# Patient Record
Sex: Female | Born: 1955
Health system: Southern US, Community
[De-identification: ages and names within clinical notes are randomized; demographics above are authoritative.]

## PROBLEM LIST (undated history)

## (undated) DIAGNOSIS — I214 Non-ST elevation (NSTEMI) myocardial infarction: Secondary | ICD-10-CM

## (undated) DIAGNOSIS — K219 Gastro-esophageal reflux disease without esophagitis: Secondary | ICD-10-CM

## (undated) DIAGNOSIS — I2510 Atherosclerotic heart disease of native coronary artery without angina pectoris: Secondary | ICD-10-CM

## (undated) DIAGNOSIS — I10 Essential (primary) hypertension: Secondary | ICD-10-CM

## (undated) DIAGNOSIS — R9430 Abnormal result of cardiovascular function study, unspecified: Secondary | ICD-10-CM

## (undated) DIAGNOSIS — IMO0002 Reserved for concepts with insufficient information to code with codable children: Secondary | ICD-10-CM

## (undated) DIAGNOSIS — N189 Chronic kidney disease, unspecified: Secondary | ICD-10-CM

## (undated) HISTORY — DX: Reserved for concepts with insufficient information to code with codable children: IMO0002

## (undated) HISTORY — DX: Non-ST elevation (NSTEMI) myocardial infarction: I21.4

## (undated) HISTORY — DX: Chronic kidney disease, unspecified: N18.9

## (undated) HISTORY — PX: CORONARY STENT PLACEMENT: SHX1402

## (undated) HISTORY — DX: Essential (primary) hypertension: I10

## (undated) HISTORY — DX: Abnormal result of cardiovascular function study, unspecified: R94.30

---

## 1999-04-30 ENCOUNTER — Emergency Department (HOSPITAL_COMMUNITY): Admission: EM | Admit: 1999-04-30 | Discharge: 1999-04-30 | Payer: Self-pay | Admitting: Emergency Medicine

## 1999-05-06 ENCOUNTER — Encounter: Admission: RE | Admit: 1999-05-06 | Discharge: 1999-05-06 | Payer: Self-pay | Admitting: Internal Medicine

## 1999-07-22 ENCOUNTER — Encounter: Admission: RE | Admit: 1999-07-22 | Discharge: 1999-07-22 | Payer: Self-pay | Admitting: Hematology and Oncology

## 1999-12-25 ENCOUNTER — Emergency Department (HOSPITAL_COMMUNITY): Admission: EM | Admit: 1999-12-25 | Discharge: 1999-12-25 | Payer: Self-pay | Admitting: Emergency Medicine

## 2001-05-28 ENCOUNTER — Emergency Department (HOSPITAL_COMMUNITY): Admission: EM | Admit: 2001-05-28 | Discharge: 2001-05-28 | Payer: Self-pay | Admitting: Emergency Medicine

## 2002-03-15 ENCOUNTER — Emergency Department (HOSPITAL_COMMUNITY): Admission: EM | Admit: 2002-03-15 | Discharge: 2002-03-15 | Payer: Self-pay | Admitting: Emergency Medicine

## 2002-03-15 ENCOUNTER — Encounter: Payer: Self-pay | Admitting: Emergency Medicine

## 2002-12-01 ENCOUNTER — Emergency Department (HOSPITAL_COMMUNITY): Admission: EM | Admit: 2002-12-01 | Discharge: 2002-12-01 | Payer: Self-pay | Admitting: Emergency Medicine

## 2002-12-02 ENCOUNTER — Emergency Department (HOSPITAL_COMMUNITY): Admission: EM | Admit: 2002-12-02 | Discharge: 2002-12-02 | Payer: Self-pay | Admitting: Emergency Medicine

## 2006-07-15 ENCOUNTER — Emergency Department (HOSPITAL_COMMUNITY): Admission: EM | Admit: 2006-07-15 | Discharge: 2006-07-15 | Payer: Self-pay | Admitting: Emergency Medicine

## 2006-08-17 ENCOUNTER — Emergency Department (HOSPITAL_COMMUNITY): Admission: EM | Admit: 2006-08-17 | Discharge: 2006-08-17 | Payer: Self-pay | Admitting: *Deleted

## 2007-02-08 ENCOUNTER — Emergency Department (HOSPITAL_COMMUNITY): Admission: EM | Admit: 2007-02-08 | Discharge: 2007-02-08 | Payer: Self-pay | Admitting: Emergency Medicine

## 2007-07-13 ENCOUNTER — Emergency Department (HOSPITAL_COMMUNITY): Admission: EM | Admit: 2007-07-13 | Discharge: 2007-07-13 | Payer: Self-pay | Admitting: Emergency Medicine

## 2008-07-11 ENCOUNTER — Emergency Department (HOSPITAL_COMMUNITY): Admission: EM | Admit: 2008-07-11 | Discharge: 2008-07-11 | Payer: Self-pay | Admitting: Emergency Medicine

## 2009-08-06 ENCOUNTER — Emergency Department (HOSPITAL_COMMUNITY): Admission: EM | Admit: 2009-08-06 | Discharge: 2009-08-06 | Payer: Self-pay | Admitting: Emergency Medicine

## 2009-10-23 ENCOUNTER — Encounter (INDEPENDENT_AMBULATORY_CARE_PROVIDER_SITE_OTHER): Payer: Self-pay | Admitting: Family Medicine

## 2009-10-23 ENCOUNTER — Ambulatory Visit: Payer: Self-pay | Admitting: Internal Medicine

## 2009-10-23 LAB — CONVERTED CEMR LAB
BUN: 9 mg/dL (ref 6–23)
CO2: 18 meq/L — ABNORMAL LOW (ref 19–32)
Calcium: 10 mg/dL (ref 8.4–10.5)
Chloride: 108 meq/L (ref 96–112)
Creatinine, Ser: 0.65 mg/dL (ref 0.40–1.20)
Eosinophils Absolute: 0 10*3/uL (ref 0.0–0.7)
Eosinophils Relative: 0 % (ref 0–5)
Glucose, Bld: 90 mg/dL (ref 70–99)
HCT: 44.6 % (ref 36.0–46.0)
Lymphs Abs: 2.2 10*3/uL (ref 0.7–4.0)
MCV: 97.8 fL (ref 78.0–100.0)
Monocytes Absolute: 0.3 10*3/uL (ref 0.1–1.0)
Monocytes Relative: 6 % (ref 3–12)
Platelets: 313 10*3/uL (ref 150–400)
RBC: 4.56 M/uL (ref 3.87–5.11)
TSH: 1.183 microintl units/mL (ref 0.350–4.500)
WBC: 5 10*3/uL (ref 4.0–10.5)

## 2010-02-27 ENCOUNTER — Ambulatory Visit (HOSPITAL_COMMUNITY): Admission: RE | Admit: 2010-02-27 | Discharge: 2010-02-27 | Payer: Self-pay | Admitting: Internal Medicine

## 2010-03-03 ENCOUNTER — Ambulatory Visit (HOSPITAL_COMMUNITY): Admission: RE | Admit: 2010-03-03 | Discharge: 2010-03-03 | Payer: Self-pay | Admitting: Internal Medicine

## 2010-03-07 ENCOUNTER — Encounter: Admission: RE | Admit: 2010-03-07 | Discharge: 2010-03-07 | Payer: Self-pay | Admitting: Internal Medicine

## 2010-06-15 ENCOUNTER — Encounter: Payer: Self-pay | Admitting: Internal Medicine

## 2010-08-17 LAB — DIFFERENTIAL
Eosinophils Absolute: 0 10*3/uL (ref 0.0–0.7)
Eosinophils Relative: 1 % (ref 0–5)
Lymphocytes Relative: 40 % (ref 12–46)
Lymphs Abs: 1.8 10*3/uL (ref 0.7–4.0)
Monocytes Absolute: 0.3 10*3/uL (ref 0.1–1.0)
Monocytes Relative: 6 % (ref 3–12)

## 2010-08-17 LAB — COMPREHENSIVE METABOLIC PANEL
ALT: 54 U/L — ABNORMAL HIGH (ref 0–35)
AST: 60 U/L — ABNORMAL HIGH (ref 0–37)
Albumin: 3.9 g/dL (ref 3.5–5.2)
CO2: 22 mEq/L (ref 19–32)
Calcium: 9.2 mg/dL (ref 8.4–10.5)
GFR calc Af Amer: >60 mL/min (ref >60)
GFR calc non Af Amer: >60 mL/min (ref >60)
Sodium: 140 mEq/L (ref 135–145)
Total Protein: 7.7 g/dL (ref 6.0–8.3)

## 2010-08-17 LAB — RAPID URINE DRUG SCREEN, HOSP PERFORMED
Amphetamines: NOT DETECTED
Benzodiazepines: NOT DETECTED
Opiates: NOT DETECTED
Tetrahydrocannabinol: NOT DETECTED

## 2010-08-17 LAB — POCT CARDIAC MARKERS
CKMB, poc: 1.3 ng/mL (ref 1.0–8.0)
Troponin i, poc: <0.05 ng/mL (ref 0.00–0.09)

## 2010-08-17 LAB — CBC
MCHC: 32.9 g/dL (ref 30.0–36.0)
Platelets: 261 10*3/uL (ref 150–400)
RBC: 4.53 MIL/uL (ref 3.87–5.11)

## 2010-08-17 LAB — URINALYSIS, ROUTINE W REFLEX MICROSCOPIC
Bilirubin Urine: NEGATIVE
Nitrite: NEGATIVE
Protein, ur: NEGATIVE mg/dL
Specific Gravity, Urine: 1.006 (ref 1.005–1.030)
Urobilinogen, UA: 0.2 mg/dL (ref 0.0–1.0)

## 2010-08-17 LAB — URINE MICROSCOPIC-ADD ON

## 2011-02-20 ENCOUNTER — Other Ambulatory Visit (HOSPITAL_COMMUNITY): Payer: Self-pay | Admitting: Family Medicine

## 2011-02-20 DIAGNOSIS — Z1231 Encounter for screening mammogram for malignant neoplasm of breast: Secondary | ICD-10-CM

## 2011-03-11 ENCOUNTER — Ambulatory Visit (HOSPITAL_COMMUNITY)
Admission: RE | Admit: 2011-03-11 | Discharge: 2011-03-11 | Disposition: A | Discharge status: Home / Self Care | Payer: Self-pay | Source: Ambulatory Visit | Attending: Family Medicine | Admitting: Family Medicine

## 2011-03-11 DIAGNOSIS — Z1231 Encounter for screening mammogram for malignant neoplasm of breast: Secondary | ICD-10-CM | POA: Insufficient documentation

## 2012-03-28 ENCOUNTER — Other Ambulatory Visit (HOSPITAL_COMMUNITY): Payer: Self-pay | Admitting: Family Medicine

## 2013-04-13 ENCOUNTER — Emergency Department (HOSPITAL_COMMUNITY): Payer: Self-pay

## 2013-04-13 ENCOUNTER — Encounter (HOSPITAL_COMMUNITY): Payer: Self-pay | Admitting: Emergency Medicine

## 2013-04-13 ENCOUNTER — Emergency Department (HOSPITAL_COMMUNITY)
Admission: EM | Admit: 2013-04-13 | Discharge: 2013-04-14 | Disposition: A | Discharge status: Home / Self Care | Payer: Self-pay | Attending: Emergency Medicine | Admitting: Emergency Medicine

## 2013-04-13 ENCOUNTER — Emergency Department (INDEPENDENT_AMBULATORY_CARE_PROVIDER_SITE_OTHER)
Admission: EM | Admit: 2013-04-13 | Discharge: 2013-04-13 | Disposition: A | Discharge status: Nursing Home (Includes ICF) | Payer: Self-pay | Source: Home / Self Care | Attending: Family Medicine | Admitting: Family Medicine

## 2013-04-13 DIAGNOSIS — R252 Cramp and spasm: Secondary | ICD-10-CM

## 2013-04-13 DIAGNOSIS — K047 Periapical abscess without sinus: Secondary | ICD-10-CM

## 2013-04-13 DIAGNOSIS — R259 Unspecified abnormal involuntary movements: Secondary | ICD-10-CM

## 2013-04-13 DIAGNOSIS — F17200 Nicotine dependence, unspecified, uncomplicated: Secondary | ICD-10-CM | POA: Insufficient documentation

## 2013-04-13 DIAGNOSIS — K044 Acute apical periodontitis of pulpal origin: Secondary | ICD-10-CM | POA: Insufficient documentation

## 2013-04-13 DIAGNOSIS — Z79899 Other long term (current) drug therapy: Secondary | ICD-10-CM | POA: Insufficient documentation

## 2013-04-13 LAB — BASIC METABOLIC PANEL
BUN: 9 mg/dL (ref 6–23)
CO2: 23 mEq/L (ref 19–32)
Chloride: 101 mEq/L (ref 96–112)
Creatinine, Ser: 0.69 mg/dL (ref 0.50–1.10)
GFR calc Af Amer: >90 mL/min (ref >90)
Potassium: 4 mEq/L (ref 3.5–5.1)

## 2013-04-13 LAB — CBC WITH DIFFERENTIAL/PLATELET
Basophils Relative: 0 % (ref 0–1)
HCT: 40.6 % (ref 36.0–46.0)
Hemoglobin: 14.5 g/dL (ref 12.0–15.0)
Lymphocytes Relative: 29 % (ref 12–46)
Monocytes Absolute: 0.5 10*3/uL (ref 0.1–1.0)
Monocytes Relative: 7 % (ref 3–12)
Neutro Abs: 4.1 10*3/uL (ref 1.7–7.7)
Neutrophils Relative %: 63 % (ref 43–77)
RBC: 4.5 MIL/uL (ref 3.87–5.11)
WBC: 6.5 10*3/uL (ref 4.0–10.5)

## 2013-04-13 MED ORDER — IOHEXOL 300 MG/ML  SOLN
70.0000 mL | Freq: Once | INTRAMUSCULAR | Status: AC | PRN
  Administered 2013-04-13: 70 mL via INTRAVENOUS

## 2013-04-13 MED ORDER — MORPHINE SULFATE 4 MG/ML IJ SOLN
6.0000 mg | Freq: Once | INTRAMUSCULAR | Status: AC
  Administered 2013-04-13: 6 mg via INTRAVENOUS
  Filled 2013-04-13: qty 2

## 2013-04-13 MED ORDER — AMPICILLIN-SULBACTAM SODIUM 3 (2-1) G IJ SOLR
3.0000 g | Freq: Once | INTRAMUSCULAR | Status: AC
  Administered 2013-04-13: 3 g via INTRAVENOUS
  Filled 2013-04-13: qty 3

## 2013-04-13 MED ORDER — SODIUM CHLORIDE 0.9 % IV SOLN
1000.0000 mL | INTRAVENOUS | Status: DC
  Administered 2013-04-13: 1000 mL via INTRAVENOUS

## 2013-04-13 MED ORDER — SODIUM CHLORIDE 0.9 % IV SOLN
1000.0000 mL | Freq: Once | INTRAVENOUS | Status: AC
  Administered 2013-04-13: 1000 mL via INTRAVENOUS

## 2013-04-13 NOTE — ED Notes (Signed)
Pleural rub auscultated in Left Upper Lobe.

## 2013-04-13 NOTE — ED Notes (Signed)
Pt c/o facial swelling at jaw, throat onset 2 weeks... Pain radiates to both ears Sxs also include: fevers, hurts to swallow and to open mouth Denies: v/n/d   BP is 211/117... Notified Zack B, PA Denies: CP, weakness, nauseas, diaphoresis Alert w/no signs of acute distress.

## 2013-04-13 NOTE — ED Provider Notes (Signed)
Medical screening examination/treatment/procedure(s) were performed by resident physician or non-physician practitioner and as supervising physician I was immediately available for consultation/collaboration.   Barkley Bruns MD.   Linna Hoff, MD 04/13/13 2112

## 2013-04-13 NOTE — ED Provider Notes (Signed)
CSN: [id number]     Arrival date & time 04/13/13  1655 History   First MD Initiated Contact with Patient 04/13/13 1922     Chief Complaint  Patient presents with  . Facial Swelling    HPI Patient reports ongoing facial pain and dental pain over the past 2 weeks.  She was sent to the emergency department from the urgent care where she was seen and evaluated for possible dental infection.  She reports pain with opening her mouth.  She denies pain under her chin and oriented her anterior neck.  No fevers or chills.  She does not have a dentist.  She's been known to have poor dental decay for some time.  She does report some pain with swallowing.  She denies sore throat.   History reviewed. No pertinent past medical history. History reviewed. No pertinent past surgical history. History reviewed. No pertinent family history. History  Substance Use Topics  . Smoking status: Current Every Day Smoker -- 1.00 packs/day    Types: Cigarettes  . Smokeless tobacco: Not on file  . Alcohol Use: No   OB History   Grav Para Term Preterm Abortions TAB SAB Ect Mult Living                 Review of Systems  All other systems reviewed and are negative.    Allergies  Review of patient's allergies indicates no known allergies.  Home Medications   Current Outpatient Rx  Name  Route  Sig  Dispense  Refill  . acetaminophen (TYLENOL) 500 MG tablet   Oral   Take 1,000 mg by mouth every 6 (six) hours as needed for mild pain.         . Aspirin-Salicylamide-Caffeine (BC HEADACHE POWDER PO)   Oral   Take 1 packet by mouth daily as needed (pain).         Marland Kitchen ibuprofen (ADVIL,MOTRIN) 200 MG tablet   Oral   Take 400 mg by mouth every 6 (six) hours as needed for moderate pain.         Marland Kitchen HYDROcodone-acetaminophen (HYCET) 7.5-325 mg/15 ml solution   Oral   Take 15 mLs by mouth 4 (four) times daily as needed for moderate pain.   120 mL   0   . penicillin v potassium (VEETID) 500 MG tablet    Oral   Take 1 tablet (500 mg total) by mouth 4 (four) times daily.   28 tablet   0    BP 199/90  Pulse 73  Temp(Src) 98.6 F (37 C) (Oral)  Resp 19  Wt 140 lb 8 oz (63.73 kg)  SpO2 97% Physical Exam  Nursing note and vitals reviewed. Constitutional: She is oriented to person, place, and time. She appears well-developed and well-nourished. No distress.  HENT:  Head: Normocephalic and atraumatic.  Very poor and for oral decay with multiple missing teeth.  Patient with no trismus.  The space under her tongue is soft.  Tolerating secretions.  Oral airway patent.  Uvula midline.  Posterior pharynx normal.  Mild tenderness of the right lower second molar.  Small amount of pus noted from the gingival surface.  Eyes: EOM are normal.  Neck: Normal range of motion.  Cardiovascular: Normal rate, regular rhythm and normal heart sounds.   Pulmonary/Chest: Effort normal and breath sounds normal.  Abdominal: Soft. She exhibits no distension. There is no tenderness.  Musculoskeletal: Normal range of motion.  Neurological: She is alert and oriented to person, place,  and time.  Skin: Skin is warm and dry.  Psychiatric: She has a normal mood and affect. Judgment normal.    ED Course  Procedures (including critical care time) Labs Review Labs Reviewed  BASIC METABOLIC PANEL - Abnormal; Notable for the following:    Calcium 10.8 (*)    All other components within normal limits  CULTURE, BLOOD (ROUTINE X 2)  CULTURE, BLOOD (ROUTINE X 2)  CBC WITH DIFFERENTIAL   Imaging Review Ct Soft Tissue Neck W Contrast  04/13/2013   CLINICAL DATA:  Right jaw swelling and dental infection  EXAM: CT NECK WITH CONTRAST  TECHNIQUE: Multidetector CT imaging of the neck was performed using the standard protocol following the bolus administration of intravenous contrast.  CONTRAST:  70mL OMNIPAQUE IOHEXOL 300 MG/ML  SOLN  COMPARISON:  Orthopantogram 07/13/2007  FINDINGS: There is extensive cavities affecting the  remaining mandibular and maxillary teeth. Relative to the patient's right jaw swelling, most notable finding is a partly impacted right lower 2nd molar with large periapical erosion/abscess, present since 2009. There is asymmetric thickening of the soft tissues on the right, consistent with inflammation. No subperiosteal abscess is seen. The right mandibular body is thickened and sclerotic; there is suggestion of this on previous radiography as well. No aggressive periosteal reaction. No floor of mouth inflammation. No venous compromise.  Advanced bilateral carotid bifurcation atherosclerosis with moderate to advanced proximal ICA stenosis on the left. There is extensive soft tissue/low-attenuation atheroma the left. Mild emphysema at the apices.  No evidence of mass along the surfaces of the aerodigestive tract. The salivary and thyroid glands are unremarkable. Degenerative disc disease with disc narrowing and endplate spurring narrowing of the ventral canal from C4 to C7.  IMPRESSION: 1. 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 No subperiosteal abscess. 2. Numerous dental cavities. 3. Carotid bifurcation atherosclerosis with advanced proximal ICA stenosis on the left. Recommend outpatient workup.   Electronically Signed   By: Tiburcio Pea M.D.   On: 04/13/2013 22:32  I personally reviewed the imaging tests through PACS system I reviewed available ER/hospitalization records through the EMR   EKG Interpretation   None       MDM   1. Dental infection    Chronic periapical erosion.  Patiently placed on antibiotics.  I do not believe she needs to go to the operating room tonight.  Dental followup.  She understands return to the ER for new worsening symptoms    Lyanne Co, MD 04/14/13 [phone]

## 2013-04-13 NOTE — ED Notes (Signed)
Pt in from urgent care, went there for evaluation of jaw swelling and bilateral ear pain, fever today, sent here to r/o infection and osteomyelitis, possible need for IV antibiotics per urgent care MD.

## 2013-04-13 NOTE — ED Provider Notes (Signed)
CSN: [id number]     Arrival date & time 04/13/13  1428 History   First MD Initiated Contact with Patient 04/13/13 1600     Chief Complaint  Patient presents with  . Facial Swelling   (Consider location/radiation/quality/duration/timing/severity/associated sxs/prior Treatment) HPI Comments: 57 year old female presents complaining of jaw pain and swelling. This is been going on for about 2 weeks. She has pain and swelling in her bilateral lower jaw. She also is having severe pain when trying to open her mouth. She denies fever, chills, NVD. The pain is worse on the left side than the right side. The pain goes down her throat and she has pain with swallowing.   History reviewed. No pertinent past medical history. History reviewed. No pertinent past surgical history. No family history on file. History  Substance Use Topics  . Smoking status: Current Every Day Smoker -- 1.00 packs/day    Types: Cigarettes  . Smokeless tobacco: Not on file  . Alcohol Use: Not on file   OB History   Grav Para Term Preterm Abortions TAB SAB Ect Mult Living                 Review of Systems  Constitutional: Negative for fever and chills.  HENT: Positive for dental problem and sore throat.   Eyes: Negative for visual disturbance.  Respiratory: Negative for cough and shortness of breath.   Cardiovascular: Negative for chest pain, palpitations and leg swelling.  Gastrointestinal: Negative for nausea, vomiting and abdominal pain.  Endocrine: Negative for polydipsia and polyuria.  Genitourinary: Negative for dysuria, urgency and frequency.  Musculoskeletal: Negative for arthralgias and myalgias.  Skin: Negative for rash.  Neurological: Negative for dizziness, weakness and light-headedness.    Allergies  Review of patient's allergies indicates no known allergies.  Home Medications  No current outpatient prescriptions on file. BP 201/121  Pulse 80  Temp(Src) 99.1 F (37.3 C) (Oral)  Resp 24  SpO2  97% Physical Exam  Nursing note and vitals reviewed. Constitutional: She is oriented to person, place, and time. Vital signs are normal. She appears well-developed and well-nourished. No distress.  HENT:  Head: Normocephalic and atraumatic.  Mouth/Throat: Oropharynx is clear and moist. There is trismus in the jaw. Abnormal dentition. Dental abscesses and dental caries present.    Very poor dentition. Obvious purulent discharge from the posterior molar on the left lower jaw and lateral to the right lower posterior jaw  Pulmonary/Chest: Effort normal. No respiratory distress.  Lymphadenopathy:       Head (right side): Tonsillar adenopathy present.       Head (left side): Tonsillar (greater than right, tender) adenopathy present.  Neurological: She is alert and oriented to person, place, and time. She has normal strength. Coordination normal.  Skin: Skin is warm and dry. No rash noted. She is not diaphoretic.  Psychiatric: She has a normal mood and affect. Judgment normal.    ED Course  Procedures (including critical care time) Labs Review Labs Reviewed - No data to display Imaging Review No results found.    MDM   1. Dental abscess   2. Trismus    This patient has extensive dental space infection with bilateral abscesses, trismus. She will likely need IV antibiotics, CT to rule out osteo-myelitis of jaw, possible debridement. Transferred to the ED via shuttle.     Graylon Good, PA-C 04/13/13 [phone]

## 2013-04-14 MED ORDER — HYDROCODONE-ACETAMINOPHEN 7.5-325 MG/15ML PO SOLN: 15.0000 mL | Freq: Four times a day (QID) | ORAL | Status: AC | PRN

## 2013-04-14 MED ORDER — PENICILLIN V POTASSIUM 500 MG PO TABS: 500.0000 mg | ORAL_TABLET | Freq: Four times a day (QID) | ORAL | Status: DC

## 2013-04-14 NOTE — ED Notes (Signed)
2nd liter NS infusion completed.

## 2013-04-20 LAB — CULTURE, BLOOD (ROUTINE X 2)

## 2014-08-10 DIAGNOSIS — I214 Non-ST elevation (NSTEMI) myocardial infarction: Secondary | ICD-10-CM

## 2014-08-10 HISTORY — DX: Non-ST elevation (NSTEMI) myocardial infarction: I21.4

## 2014-08-11 ENCOUNTER — Emergency Department (HOSPITAL_COMMUNITY): Payer: Self-pay

## 2014-08-11 ENCOUNTER — Encounter (HOSPITAL_COMMUNITY): Payer: Self-pay

## 2014-08-11 ENCOUNTER — Inpatient Hospital Stay (HOSPITAL_COMMUNITY)
Admission: EM | Admit: 2014-08-11 | Discharge: 2014-08-14 | DRG: 247 | Disposition: A | Discharge status: Home / Self Care | Payer: Self-pay | Attending: Internal Medicine | Admitting: Internal Medicine

## 2014-08-11 DIAGNOSIS — R778 Other specified abnormalities of plasma proteins: Secondary | ICD-10-CM | POA: Diagnosis present

## 2014-08-11 DIAGNOSIS — D72819 Decreased white blood cell count, unspecified: Secondary | ICD-10-CM | POA: Diagnosis present

## 2014-08-11 DIAGNOSIS — F17210 Nicotine dependence, cigarettes, uncomplicated: Secondary | ICD-10-CM | POA: Diagnosis present

## 2014-08-11 DIAGNOSIS — R7989 Other specified abnormal findings of blood chemistry: Secondary | ICD-10-CM | POA: Diagnosis present

## 2014-08-11 DIAGNOSIS — R030 Elevated blood-pressure reading, without diagnosis of hypertension: Secondary | ICD-10-CM | POA: Diagnosis present

## 2014-08-11 DIAGNOSIS — I25110 Atherosclerotic heart disease of native coronary artery with unstable angina pectoris: Secondary | ICD-10-CM | POA: Diagnosis present

## 2014-08-11 DIAGNOSIS — I214 Non-ST elevation (NSTEMI) myocardial infarction: Principal | ICD-10-CM | POA: Diagnosis present

## 2014-08-11 DIAGNOSIS — I10 Essential (primary) hypertension: Secondary | ICD-10-CM | POA: Diagnosis present

## 2014-08-11 DIAGNOSIS — Z720 Tobacco use: Secondary | ICD-10-CM | POA: Diagnosis present

## 2014-08-11 LAB — BASIC METABOLIC PANEL
Anion gap: 10 (ref 5–15)
BUN: 14 mg/dL (ref 6–23)
CO2: 21 mmol/L (ref 19–32)
Calcium: 9.9 mg/dL (ref 8.4–10.5)
Chloride: 107 mmol/L (ref 96–112)
Creatinine, Ser: 0.68 mg/dL (ref 0.50–1.10)
GFR calc Af Amer: >90 mL/min (ref >90)
GFR calc non Af Amer: >90 mL/min (ref >90)
Glucose, Bld: 107 mg/dL — ABNORMAL HIGH (ref 70–99)
Potassium: 4 mmol/L (ref 3.5–5.1)
SODIUM: 138 mmol/L (ref 135–145)

## 2014-08-11 LAB — I-STAT TROPONIN, ED: Troponin i, poc: 0.11 ng/mL (ref 0.00–0.08)

## 2014-08-11 LAB — APTT: APTT: 31 s (ref 24–37)

## 2014-08-11 LAB — CBC
HCT: 43.5 % (ref 36.0–46.0)
Hemoglobin: 14.9 g/dL (ref 12.0–15.0)
MCH: 31.3 pg (ref 26.0–34.0)
MCHC: 34.3 g/dL (ref 30.0–36.0)
MCV: 91.4 fL (ref 78.0–100.0)
Platelets: 281 10*3/uL (ref 150–400)
RBC: 4.76 MIL/uL (ref 3.87–5.11)
RDW: 14.4 % (ref 11.5–15.5)
WBC: 3.9 10*3/uL — ABNORMAL LOW (ref 4.0–10.5)

## 2014-08-11 LAB — TROPONIN I
TROPONIN I: 0.23 ng/mL — AB (ref <0.031)
Troponin I: 0.15 ng/mL — ABNORMAL HIGH (ref <0.031)

## 2014-08-11 LAB — PROTIME-INR
INR: 0.98 (ref 0.00–1.49)
Prothrombin Time: 13.1 seconds (ref 11.6–15.2)

## 2014-08-11 LAB — HEPARIN LEVEL (UNFRACTIONATED): HEPARIN UNFRACTIONATED: 0.33 [IU]/mL (ref 0.30–0.70)

## 2014-08-11 MED ORDER — ASPIRIN 300 MG RE SUPP: 300.0000 mg | RECTAL | Status: AC

## 2014-08-11 MED ORDER — ATORVASTATIN CALCIUM 40 MG PO TABS: 40.0000 mg | ORAL_TABLET | Freq: Every day | ORAL | Status: DC

## 2014-08-11 MED ORDER — METOPROLOL TARTRATE 12.5 MG HALF TABLET
12.5000 mg | ORAL_TABLET | Freq: Two times a day (BID) | ORAL | Status: DC
  Administered 2014-08-11 – 2014-08-14 (×6): 12.5 mg via ORAL
  Filled 2014-08-11 (×6): qty 1

## 2014-08-11 MED ORDER — HEPARIN SODIUM (PORCINE) 5000 UNIT/ML IJ SOLN: 4000.0000 [IU] | Freq: Once | INTRAMUSCULAR | Status: DC

## 2014-08-11 MED ORDER — HEPARIN (PORCINE) IN NACL 100-0.45 UNIT/ML-% IJ SOLN
800.0000 [IU]/h | INTRAMUSCULAR | Status: DC
  Administered 2014-08-11 – 2014-08-12 (×2): 800 [IU]/h via INTRAVENOUS
  Filled 2014-08-11 (×3): qty 250

## 2014-08-11 MED ORDER — NITROGLYCERIN IN D5W 200-5 MCG/ML-% IV SOLN
3.0000 ug/min | INTRAVENOUS | Status: DC
  Administered 2014-08-12: 6 ug/min via INTRAVENOUS
  Administered 2014-08-13: 30 ug/min via INTRAVENOUS
  Filled 2014-08-11: qty 250

## 2014-08-11 MED ORDER — ASPIRIN 81 MG PO CHEW: 324.0000 mg | CHEWABLE_TABLET | ORAL | Status: AC

## 2014-08-11 MED ORDER — NITROGLYCERIN IN D5W 200-5 MCG/ML-% IV SOLN
5.0000 ug/min | Freq: Once | INTRAVENOUS | Status: AC
  Administered 2014-08-11: 5 ug/min via INTRAVENOUS
  Filled 2014-08-11: qty 250

## 2014-08-11 MED ORDER — ASPIRIN 81 MG PO CHEW
324.0000 mg | CHEWABLE_TABLET | Freq: Once | ORAL | Status: AC
  Administered 2014-08-11: 324 mg via ORAL
  Filled 2014-08-11: qty 4

## 2014-08-11 MED ORDER — ACETAMINOPHEN 325 MG PO TABS
650.0000 mg | ORAL_TABLET | ORAL | Status: DC | PRN
  Administered 2014-08-11 – 2014-08-12 (×5): 650 mg via ORAL
  Filled 2014-08-11 (×5): qty 2

## 2014-08-11 MED ORDER — SODIUM CHLORIDE 0.9 % IV SOLN: 250.0000 mL | INTRAVENOUS | Status: DC | PRN

## 2014-08-11 MED ORDER — HEPARIN BOLUS VIA INFUSION
3500.0000 [IU] | INTRAVENOUS | Status: DC
  Filled 2014-08-11: qty 3500

## 2014-08-11 MED ORDER — ASPIRIN EC 81 MG PO TBEC
81.0000 mg | DELAYED_RELEASE_TABLET | Freq: Every day | ORAL | Status: DC
  Administered 2014-08-12 – 2014-08-14 (×2): 81 mg via ORAL
  Filled 2014-08-11 (×2): qty 1

## 2014-08-11 MED ORDER — HEPARIN SODIUM (PORCINE) 5000 UNIT/ML IJ SOLN
4000.0000 [IU] | Freq: Once | INTRAMUSCULAR | Status: AC
  Administered 2014-08-11: 4000 [IU] via INTRAVENOUS
  Filled 2014-08-11: qty 0.8

## 2014-08-11 MED ORDER — SODIUM CHLORIDE 0.9 % IJ SOLN
3.0000 mL | Freq: Two times a day (BID) | INTRAMUSCULAR | Status: DC
  Administered 2014-08-11 – 2014-08-13 (×3): 3 mL via INTRAVENOUS

## 2014-08-11 MED ORDER — ATORVASTATIN CALCIUM 40 MG PO TABS
40.0000 mg | ORAL_TABLET | Freq: Every day | ORAL | Status: DC
  Administered 2014-08-12 – 2014-08-13 (×2): 40 mg via ORAL
  Filled 2014-08-11 (×4): qty 1

## 2014-08-11 MED ORDER — ONDANSETRON HCL 4 MG/2ML IJ SOLN: 4.0000 mg | Freq: Four times a day (QID) | INTRAMUSCULAR | Status: DC | PRN

## 2014-08-11 MED ORDER — SODIUM CHLORIDE 0.9 % IJ SOLN: 3.0000 mL | INTRAMUSCULAR | Status: DC | PRN

## 2014-08-11 NOTE — ED Provider Notes (Signed)
CSN: [id number]     Arrival date & time 08/11/14  0702 History   First MD Initiated Contact with Patient 08/11/14 0745     Chief Complaint  Patient presents with  . Chest Pain     (Consider location/radiation/quality/duration/timing/severity/associated sxs/prior Treatment) HPI Comments: Patient presents with chest pain. She states she's had intermittent chest pains for the last month but they've been worsening over last few days. She describes a tightness in the center of her chest that radiates to both arms and occasionally into her jaw. At times it's worse after she eats but it's also exertional and she says it's worse with walking particularly when she walks any amount of distance. She denies any associated shortness of breath. She is gotten diaphoretic with that in the past. She has had some nausea and vomiting with it yesterday but none other than that. She has taken some Pepto-Bismol with some relief in symptoms. She denies any past cardiac history. She smokes cigarettes. She's been told her blood pressures been elevated in the past but she's not on any antihypertensive medication. She denies any past cardiac evaluations in the past. She denies any leg pain or swelling.  Patient is a 59 y.o. female presenting with chest pain.  Chest Pain Associated symptoms: diaphoresis, fatigue, nausea and vomiting   Associated symptoms: no abdominal pain, no back pain, no cough, no dizziness, no fever, no headache, no numbness, no shortness of breath and no weakness     History reviewed. No pertinent past medical history. History reviewed. No pertinent past surgical history. History reviewed. No pertinent family history. History  Substance Use Topics  . Smoking status: Current Every Day Smoker -- 1.00 packs/day    Types: Cigarettes  . Smokeless tobacco: Not on file  . Alcohol Use: No   OB History    No data available     Review of Systems  Constitutional: Positive for diaphoresis and fatigue.  Negative for fever and chills.  HENT: Negative for congestion, rhinorrhea and sneezing.   Eyes: Negative.   Respiratory: Positive for chest tightness. Negative for cough and shortness of breath.   Cardiovascular: Positive for chest pain. Negative for leg swelling.  Gastrointestinal: Positive for nausea and vomiting. Negative for abdominal pain, diarrhea and blood in stool.  Genitourinary: Negative for frequency, hematuria, flank pain and difficulty urinating.  Musculoskeletal: Negative for back pain and arthralgias.  Skin: Negative for rash.  Neurological: Negative for dizziness, speech difficulty, weakness, numbness and headaches.      Allergies  Review of patient's allergies indicates no known allergies.  Home Medications   Prior to Admission medications   Medication Sig Start Date End Date Taking? Authorizing Provider  acetaminophen (TYLENOL) 500 MG tablet Take 1,000 mg by mouth every 6 (six) hours as needed for mild pain.   Yes Historical Provider, MD  bismuth subsalicylate (PEPTO BISMOL) 262 MG/15ML suspension Take 30 mLs by mouth every 6 (six) hours as needed for indigestion.   Yes Historical Provider, MD  penicillin v potassium (VEETID) 500 MG tablet Take 1 tablet (500 mg total) by mouth 4 (four) times daily. Patient not taking: Reported on 08/11/2014 04/14/13   Azalia BilisKevin Campos, MD   BP 151/88 mmHg  Pulse 70  Temp(Src) 98.8 F (37.1 C) (Oral)  Resp 20  Ht 5' (1.524 m)  Wt 145 lb (65.772 kg)  BMI 28.32 kg/m2  SpO2 97% Physical Exam  Constitutional: She is oriented to person, place, and time. She appears well-developed and well-nourished.  HENT:  Head: Normocephalic and atraumatic.  Eyes: Pupils are equal, round, and reactive to light.  Neck: Normal range of motion. Neck supple.  Cardiovascular: Normal rate, regular rhythm and normal heart sounds.   Pulmonary/Chest: Effort normal and breath sounds normal. No respiratory distress. She has no wheezes. She has no rales. She  exhibits no tenderness.  Abdominal: Soft. Bowel sounds are normal. There is no tenderness. There is no rebound and no guarding.  Musculoskeletal: Normal range of motion. She exhibits no edema.  Lymphadenopathy:    She has no cervical adenopathy.  Neurological: She is alert and oriented to person, place, and time.  Skin: Skin is warm and dry. No rash noted.  Psychiatric: She has a normal mood and affect.    ED Course  Procedures (including critical care time) Labs Review Labs Reviewed  CBC - Abnormal; Notable for the following:    WBC 3.9 (*)    All other components within normal limits  BASIC METABOLIC PANEL - Abnormal; Notable for the following:    Glucose, Bld 107 (*)    All other components within normal limits  I-STAT TROPOININ, ED - Abnormal; Notable for the following:    Troponin i, poc 0.11 (*)    All other components within normal limits  APTT  PROTIME-INR  TROPONIN I  TROPONIN I  TROPONIN I  HEPARIN LEVEL (UNFRACTIONATED)    Imaging Review Dg Chest 2 View  08/11/2014   CLINICAL DATA:  Midline chest pain  EXAM: CHEST  2 VIEW  COMPARISON:  None.  FINDINGS: The heart size and mediastinal contours are within normal limits. Both lungs are clear. The visualized skeletal structures show degenerative change of thoracic spine.  IMPRESSION: No active cardiopulmonary disease.   Electronically Signed   By: Alcide Clever M.D.   On: 08/11/2014 08:44     EKG Interpretation   Date/Time:  Saturday August 11 2014 07:13:21 EDT Ventricular Rate:  80 PR Interval:  137 QRS Duration: 75 QT Interval:  448 QTC Calculation: 517 R Axis:   44 Text Interpretation:  Sinus rhythm Anterior infarct, old Borderline T  abnormalities, inferior leads Prolonged QT interval Baseline wander in  lead(s) II III aVF V2 Since last tracing QT has lengthened and ST  abnormality is new Confirmed by Effie Shy  MD, ELLIOTT ([id number]) on 08/11/2014  7:40:14 AM      MDM   Final diagnoses:  NSTEMI (non-ST  elevated myocardial infarction)    Patient presents with intermittent chest pains for the last month. She does have an exertional component to the symptoms although at times it is related to eating. She has some risk factors for coronary artery disease. She has some abnormal changes on EKG. She has a positive i-STAT troponin. I consult with cardiology, Dr Myrtis Ser,  who will see the patient and he requests hospitalist to admit the patient.  Dr. David Stall will admit the pt.  patient has been pain-free throughout the ED course.    Rolan Bucco, MD 08/11/14 [phone]

## 2014-08-11 NOTE — ED Notes (Signed)
BMP recollected and sent to lab, lab stated hemolyzed

## 2014-08-11 NOTE — ED Notes (Signed)
Chest pain x 1 month.  Radiating down arm.  Worse with movement.  Denies cough/congestion.  No cardiac history

## 2014-08-11 NOTE — ED Notes (Signed)
Spoke with Pam, CN 

## 2014-08-11 NOTE — Progress Notes (Signed)
Paged M. Lynch about patient arrival and patient would like to eat since she hasn't in 24 hrs. Awaiting call back

## 2014-08-11 NOTE — ED Notes (Signed)
Dr. Myrtis SerKatz called for max dose order for nitroglycerin. Per Dr. Myrtis SerKatz, titrate nitroglycerin to max dose of 5520mcg/min. Dr. Myrtis SerKatz made aware of pt's vitals and absence of pain. Informed of new [facility] bed request as well.

## 2014-08-11 NOTE — Progress Notes (Signed)
Pharmacy: [facility]  [age] yoF presents with chest pain x 1 month, worsening over last few days with pain radiating down both arms. No cardiac history. Troponin 0.11. ECG shows ST abnormality. Pharmacy consulted to start IV heparin for NSTEMI. Plan for possible cardiac heart cath on Monday.  First heparin level now back therapeutic at 0.33 (goal 0.3-0.7).  No bleeding documented.  Plan: - continue current heparin rate at 800 unitshr - will f/u with AM labs and adjust if needed.  Dorna LeitzAnh Miyonna [place]rm[place], PharmD, BCPS

## 2014-08-11 NOTE — Progress Notes (Signed)
[facility] Pt has not arrived to unit by end of shift. [facility].

## 2014-08-11 NOTE — H&P (Signed)
[facility] History and Physical  Kim Brady ZOX:[id number] DOB: 1956-04-19 DOA: 08/11/2014  Referring physician: Dr. Fredderick PhenixBelfi PCP: No PCP Per Patient   Chief Complaint: Chest pain  HPI: Kim KettleVanessa L Ek is a 59 y.o. female with past medical history of tobacco abuse that comes in as she's been having chest pain intermittently for a month. She relates that a day prior to admission she started getting worst as it was not being relief with sitting down. She relates that the pain would be made worse by exertion when she would walk more than a block and made better with rest after 5 minutes. She relates is in her epigastric area and retrosternal area radiates to both arms. She gets short of breath when she get this but no sweating. She denies any nausea or vomiting. She denies the pain is a burning pain.  In the ED: EKG was done that shows normal sinus rhythm, with initial set of point of care markers of 0.11, revealed troponin 0.23 currently on IV nitroglycerin and heparin has remained chest pain-free.   Review of Systems:  Constitutional:  No weight loss, night sweats, Fevers, chills, fatigue.  HEENT:  No headaches, Difficulty swallowing,Tooth/dental problems,Sore throat,  No sneezing, itching, ear ache, nasal congestion, post nasal drip,  Cardio-vascular:  Orthopnea, PND, swelling in lower extremities, anasarca, dizziness, palpitations  GI:  No heartburn, indigestion, abdominal pain, nausea, vomiting, diarrhea, change in bowel habits, loss of appetite  Resp:  No shortness of breath with exertion or at rest. No excess mucus, no productive cough, No non-productive cough, No coughing up of blood.No change in color of mucus.No wheezing.No chest wall deformity  Skin:  no rash or lesions.  GU:  no dysuria, change in color of urine, no urgency or frequency. No flank pain.  Musculoskeletal:  No joint pain or swelling. No decreased range of motion. No back pain.  Psych:  No change in  mood or affect. No depression or anxiety. No memory loss.   History reviewed. No pertinent past medical history. History reviewed. No pertinent past surgical history. Social History:  reports that she has been smoking Cigarettes.  She has been smoking about 1.00 pack per day. She does not have any smokeless tobacco history on file. She reports that she does not drink alcohol or use illicit drugs.  No Known Allergies  Family History  Problem Relation Age of Onset  . Heart attack Mother   . Heart attack Father     Prior to Admission medications   Medication Sig Start Date End Date Taking? Authorizing Provider  acetaminophen (TYLENOL) 500 MG tablet Take 1,000 mg by mouth every 6 (six) hours as needed for mild pain.   Yes Historical Provider, MD  bismuth subsalicylate (PEPTO BISMOL) 262 MG/15ML suspension Take 30 mLs by mouth every 6 (six) hours as needed for indigestion.   Yes Historical Provider, MD  penicillin v potassium (VEETID) 500 MG tablet Take 1 tablet (500 mg total) by mouth 4 (four) times daily. Patient not taking: Reported on 08/11/2014 04/14/13   Azalia BilisKevin Campos, MD   Physical Exam: Filed Vitals:   08/11/14 1140 08/11/14 1150 08/11/14 1200 08/11/14 1210  BP: 180/101 149/90 126/80 117/79  Pulse: 79 72 78 84  Temp:      TempSrc:      Resp: 15 21 24 21   Height:      Weight:      SpO2:  95%      Wt Readings from Last 3 Encounters:  08/11/14 65.772 kg (145 lb)  04/13/13 63.73 kg (140 lb 8 oz)    General:  Appears calm and comfortable Eyes: PERRL, normal lids, irises & conjunctiva ENT: grossly normal hearing, lips & tongue Neck: no LAD, masses or thyromegaly Cardiovascular: RRR, no m/r/g. No LE edema. Telemetry: SR, no arrhythmias  Respiratory: CTA bilaterally, no w/r/r. Normal respiratory effort. Abdomen: soft, ntnd Skin: no rash or induration seen on limited exam Musculoskeletal: grossly normal tone BUE/BLE Psychiatric: grossly normal mood and affect, speech fluent  and appropriate Neurologic: grossly non-focal.          Labs on Admission:  Basic Metabolic Panel:  Recent Labs Lab 08/11/14 0925  NA 138  K 4.0  CL 107  CO2 21  GLUCOSE 107*  BUN 14  CREATININE 0.68  CALCIUM 9.9   Liver Function Tests: No results for input(s): AST, ALT, ALKPHOS, BILITOT, PROT, ALBUMIN in the last 168 hours. No results for input(s): LIPASE, AMYLASE in the last 168 hours. No results for input(s): AMMONIA in the last 168 hours. CBC:  Recent Labs Lab 08/11/14 0812  WBC 3.9*  HGB 14.9  HCT 43.5  MCV 91.4  PLT 281   Cardiac Enzymes:  Recent Labs Lab 08/11/14 1142  TROPONINI 0.23*    BNP (last 3 results) No results for input(s): BNP in the last 8760 hours.  ProBNP (last 3 results) No results for input(s): PROBNP in the last 8760 hours.  CBG: No results for input(s): GLUCAP in the last 168 hours.  Radiological Exams on Admission: Dg Chest 2 View  08/11/2014   CLINICAL DATA:  Midline chest pain  EXAM: CHEST  2 VIEW  COMPARISON:  None.  FINDINGS: The heart size and mediastinal contours are within normal limits. Both lungs are clear. The visualized skeletal structures show degenerative change of thoracic spine.  IMPRESSION: No active cardiopulmonary disease.   Electronically Signed   By: Alcide Clever M.D.   On: 08/11/2014 08:44    EKG: Independently reviewed. Sinus rhythm normal axis  Assessment/Plan Elevated blood pressure reading/Elevated troponin: - I counseled her about tobacco abuse. - Her pain is typical of worse with exertion and better with rest, and is now relieved with IV nitroglycerin and IV heparin. - Her cardiac enzymes seem to be trending up, I have already consulted cardiology who recommended transfer the patient to [facility], start her on low-dose metoprolol and Lipitor. She'll ready got an aspirin in the ED will continue this daily. - Continue cycle cardiac enzymes check a 2-D echo for wall motion abnormality. - Place nothing by  mouth until cardiology sees her.  Leukopenia - Unclear etiology, previous white blood cells counts were within normal limits. We'll continue to monitor.    Code Status: full DVT Prophylaxis:heparin Family Communication: none Disposition Plan: inpatient  Time spent:  Marinda Elk [facility] Pager [phone]

## 2014-08-11 NOTE — ED Notes (Signed)
[facility] called for transport. Pt is at least 6th on list. Report called to Felipa Etherrence, RN to move pt to TCU.

## 2014-08-11 NOTE — ED Notes (Signed)
Pt ambulatory to Xray.

## 2014-08-11 NOTE — Progress Notes (Addendum)
ANTICOAGULATION CONSULT NOTE - Initial Consult  Pharmacy Consult for Heparin Indication: chest pain/ACS  No Known Allergies  Patient Measurements: Height: 5' (152.4 cm) Weight: 145 lb (65.772 kg) IBW/kg (Calculated) : 45.5 Heparin Dosing Weight: 58 kg  Vital Signs: Temp: 98.8 F (37.1 C) (03/19 0715) Temp Source: Oral (03/19 0715) BP: 151/88 mmHg (03/19 1110) Pulse Rate: 70 (03/19 1110)  Labs:  Recent Labs  08/11/14 0812 08/11/14 0925  HGB 14.9  --   HCT 43.5  --   PLT 281  --   CREATININE  --  0.68    Estimated Creatinine Clearance: 62.5 mL/min (by C-G formula based on Cr of 0.68).   Medical History: History reviewed. No pertinent past medical history.   Assessment: [age] yoF presents with chest pain x 1 month, worsening over last few days with pain radiating down both arms.  No cardiac history.  Troponin 0.11.  ECG shows ST abnormality.  Pharmacy consulted to start IV heparin.  Baseline labs: Protime/INR and aPTT ordered STAT CBC WNL SCr WNL  Goal of Therapy:  Heparin level 0.3-0.7 units/ml Monitor platelets by anticoagulation protocol: Yes   Plan:  1.  Heparin 3500 unit IV bolus x 1 then start infusion at 800 units/hr. 2.  Check heparin level in 6 hours. 3.  Daily heparin level and CBC while on heparin.   Clance BollRunyon, Atalaya Zappia 08/11/2014,11:19 AM  Addendum: 08/11/2014 11:33 AM MD had placed order for 4000 unit bolus x 1 and this was given by RN.  I will cancel the 3500 unit bolus as planned above and RN instructed to start infusion at 800 units/hr after MD bolus.  Clance BollAmanda Donae Kueker, PharmD, BCPS Pager: [phone] 08/11/2014 11:35 AM

## 2014-08-11 NOTE — Consult Note (Signed)
CARDIOLOGY CONSULT NOTE   Patient ID: DALILA ARCA MRN: [id number] DOB/AGE: [id number] 61 y.o.  Admit Date: 08/11/2014 Primary Physician: No PCP Per Patient  Primary Cardiologist    Narda Rutherford   Clinical Summary Ms. Niccoli is a 59 y.o.female. She has no known prior cardiac disease. She presents with a history of one week of intermittent chest discomfort. Her worst symptoms were yesterday. She came to the emergency room today with some chest discomfort. This is relieved with nitroglycerin. Her first troponin is slightly elevated and the second troponin is slightly higher. Her EKG is abnormal with T-wave inversions. There are no old tracings. Her symptoms are consistent with non-STEMI. Heparin and IV nitroglycerin were started in the emergency room. She is pain-free.   No Known Allergies  Medications Scheduled Medications: . atorvastatin  40 mg Oral q1800     Infusions: . heparin 800 Units/hr (08/11/14 1143)     PRN Medications:     History reviewed. No pertinent past medical history.  History reviewed. No pertinent past surgical history.  Family History  Problem Relation Age of Onset  . Heart attack Mother   . Heart attack Father     Social History Ms. Urquilla reports that she has been smoking Cigarettes.  She has been smoking about 1.00 pack per day. She does not have any smokeless tobacco history on file. Ms. Thorson reports that she does not drink alcohol.  Review of Systems    Patient denies fever, chills, headache, sweats, rash, change in vision, change in hearing, cough, urinary symptoms,. All other systems are reviewed and are negative.  Physical Examination Blood pressure 142/81, pulse 78, temperature 98.8 F (37.1 C), temperature source Oral, resp. rate 18, height 5' (1.524 m), weight 145 lb (65.772 kg), SpO2 95 %. No intake or output data in the 24 hours ending 08/11/14 1543   Patient is oriented to person time and place. Affect is normal. There is  a family member in the room. Head is atraumatic. Sclera and conjunctiva are normal. There is no jugular venous distention. Lungs are clear. Respiratory effort is nonlabored. Cardiac exam reveals S1 and S2. Abdomen is soft. There is no peripheral edema. There are no musculoskeletal deformities. There are no skin rashes.  Prior Cardiac Testing/Procedures  Lab Results  Basic Metabolic Panel:  Recent Labs Lab 08/11/14 0925  NA 138  K 4.0  CL 107  CO2 21  GLUCOSE 107*  BUN 14  CREATININE 0.68  CALCIUM 9.9    Liver Function Tests: No results for input(s): AST, ALT, ALKPHOS, BILITOT, PROT, ALBUMIN in the last 168 hours.  CBC:  Recent Labs Lab 08/11/14 0812  WBC 3.9*  HGB 14.9  HCT 43.5  MCV 91.4  PLT 281    Cardiac Enzymes:  Recent Labs Lab 08/11/14 1142  TROPONINI 0.23*    BNP: Invalid input(s): POCBNP   Radiology: Dg Chest 2 View  08/11/2014   CLINICAL DATA:  Midline chest pain  EXAM: CHEST  2 VIEW  COMPARISON:  None.  FINDINGS: The heart size and mediastinal contours are within normal limits. Both lungs are clear. The visualized skeletal structures show degenerative change of thoracic spine.  IMPRESSION: No active cardiopulmonary disease.   Electronically Signed   By: Alcide Clever M.D.   On: 08/11/2014 08:44     ECG: I have reviewed today's EKG. There is no old EKG for comparison. There are inverted T waves in the inferior leads. This study is of poor quality.  There is decreased anterior R-wave progression.   Impression and Recommendations    Leukopenia    Non-STEMI (non-ST elevated myocardial infarction)    The patient's symptoms and enzymes are consistent with non-STEMI. It appears that she had her worst symptoms yesterday. She is pain-free currently. She is on heparin and nitroglycerin. She had R even started on aspirin. I have added a statin. We will watch her carefully. If return of chest discomfort we will proceed with catheterization on an urgent  basis. Otherwise we will plan for elective catheterization on Monday.    Tobacco abuse   Patient will need smoking cessation counseling.        Jerral BonitoJeff Rokhaya Quinn, MD 08/11/2014, 3:43 PM

## 2014-08-12 DIAGNOSIS — Z720 Tobacco use: Secondary | ICD-10-CM

## 2014-08-12 LAB — BASIC METABOLIC PANEL
Anion gap: 7 (ref 5–15)
BUN: 9 mg/dL (ref 6–23)
CHLORIDE: 107 mmol/L (ref 96–112)
CO2: 25 mmol/L (ref 19–32)
CREATININE: 0.76 mg/dL (ref 0.50–1.10)
Calcium: 9.5 mg/dL (ref 8.4–10.5)
GFR calc non Af Amer: 89 mL/min — ABNORMAL LOW (ref >90)
GLUCOSE: 102 mg/dL — AB (ref 70–99)
Potassium: 3.5 mmol/L (ref 3.5–5.1)
Sodium: 139 mmol/L (ref 135–145)

## 2014-08-12 LAB — CBC
HCT: 36.5 % (ref 36.0–46.0)
Hemoglobin: 12.3 g/dL (ref 12.0–15.0)
MCH: 31 pg (ref 26.0–34.0)
MCHC: 33.7 g/dL (ref 30.0–36.0)
MCV: 91.9 fL (ref 78.0–100.0)
PLATELETS: 233 10*3/uL (ref 150–400)
RBC: 3.97 MIL/uL (ref 3.87–5.11)
RDW: 14.5 % (ref 11.5–15.5)
WBC: 5.3 10*3/uL (ref 4.0–10.5)

## 2014-08-12 LAB — TROPONIN I: TROPONIN I: 0.05 ng/mL — AB (ref <0.031)

## 2014-08-12 LAB — HEPARIN LEVEL (UNFRACTIONATED): Heparin Unfractionated: 0.36 IU/mL (ref 0.30–0.70)

## 2014-08-12 MED ORDER — SODIUM CHLORIDE 0.9 % IV SOLN: 250.0000 mL | INTRAVENOUS | Status: DC | PRN

## 2014-08-12 MED ORDER — SODIUM CHLORIDE 0.9 % IV SOLN
INTRAVENOUS | Status: DC
  Administered 2014-08-13: 04:00:00 via INTRAVENOUS

## 2014-08-12 MED ORDER — ASPIRIN 81 MG PO CHEW
81.0000 mg | CHEWABLE_TABLET | ORAL | Status: AC
  Administered 2014-08-13: 81 mg via ORAL

## 2014-08-12 MED ORDER — SODIUM CHLORIDE 0.9 % IJ SOLN: 3.0000 mL | Freq: Two times a day (BID) | INTRAMUSCULAR | Status: DC

## 2014-08-12 MED ORDER — SODIUM CHLORIDE 0.9 % IJ SOLN: 3.0000 mL | INTRAMUSCULAR | Status: DC | PRN

## 2014-08-12 NOTE — Progress Notes (Signed)
Paged M. Lynch with [facility] about prolonged QTC, will continue to monitor and MD will review medications. Patient is pain free with no complaints, Nitro gtt and HBW remain on.

## 2014-08-12 NOTE — Progress Notes (Signed)
SUBJECTIVE:  Denies any further CP  OBJECTIVE:   Vitals:   Filed Vitals:   08/12/14 0518 08/12/14 0546 08/12/14 0837 08/12/14 1040  BP: 151/76 151/74 154/75 157/81  Pulse:   59   Temp: 98.3 F (36.8 C)  97.7 F (36.5 C)   TempSrc:   Oral   Resp: 21 15 15 21   Height:      Weight: 145 lb 3.2 oz (65.862 kg)     SpO2: 98%  100%    I&O's:    Intake/Output Summary (Last 24 hours) at 08/12/14 1138 Last data filed at 08/11/14 2237  Gross per 24 hour  Intake      0 ml  Output    400 ml  Net   -400 ml   TELEMETRY: Reviewed telemetry pt in NSR:     PHYSICAL EXAM General: Well developed, well nourished, in no acute distress Head: Eyes PERRLA, No xanthomas.   Normal cephalic and atramatic  Lungs:  Clear bilaterally to auscultation and percussion. Heart:   HRRR S1 S2 Pulses are 2+ & equal. Abdomen: Bowel sounds are positive, abdomen soft and non-tender without masses Extremities:  No clubbing, cyanosis or edema.  DP +1 Neuro: Alert and oriented X 3. Psych:  Good affect, responds appropriately   LABS: Basic Metabolic Panel:  Recent Labs  16/02/9602/19/16 0925 08/12/14 0150  NA 138 139  K 4.0 3.5  CL 107 107  CO2 21 25  GLUCOSE 107* 102*  BUN 14 9  CREATININE 0.68 0.76  CALCIUM 9.9 9.5   Liver Function Tests: No results for input(s): AST, ALT, ALKPHOS, BILITOT, PROT, ALBUMIN in the last 72 hours. No results for input(s): LIPASE, AMYLASE in the last 72 hours. CBC:  Recent Labs  08/11/14 0812 08/12/14 0150  WBC 3.9* 5.3  HGB 14.9 12.3  HCT 43.5 36.5  MCV 91.4 91.9  PLT 281 233   Cardiac Enzymes:  Recent Labs  08/11/14 1142 08/11/14 1741 08/12/14 0350  TROPONINI 0.23* 0.15* 0.05*   BNP: Invalid input(s): POCBNP D-Dimer: No results for input(s): DDIMER in the last 72 hours. Hemoglobin A1C: No results for input(s): HGBA1C in the last 72 hours. Fasting Lipid Panel: No results for input(s): CHOL, HDL, LDLCALC, TRIG, CHOLHDL, LDLDIRECT in the last 72  hours. Thyroid Function Tests: No results for input(s): TSH, T4TOTAL, T3FREE, THYROIDAB in the last 72 hours.  Invalid input(s): FREET3 Anemia Panel: No results for input(s): VITAMINB12, FOLATE, FERRITIN, TIBC, IRON, RETICCTPCT in the last 72 hours. Coag Panel:   Lab Results  Component Value Date   INR 0.98 08/11/2014    RADIOLOGY: Dg Chest 2 View  08/11/2014   CLINICAL DATA:  Midline chest pain  EXAM: CHEST  2 VIEW  COMPARISON:  None.  FINDINGS: The heart size and mediastinal contours are within normal limits. Both lungs are clear. The visualized skeletal structures show degenerative change of thoracic spine.  IMPRESSION: No active cardiopulmonary disease.   Electronically Signed   By: Alcide CleverMark  Lukens M.D.   On: 08/11/2014 08:44    Impression and Recommendations  1.  Leukopenia  2.  Non-STEMI (non-ST elevated myocardial infarction)  The patient's symptoms and enzymes are consistent with non-STEMI.  She is pain-free currently. She is on heparin and nitroglycerin. Continue  Aspirin/statin/bb.  We will watch her carefully. If return of chest discomfort we will proceed with catheterization on an urgent basis. Otherwise we will plan for elective catheterization on Monday.  Cardiac catheterization was discussed with the patient fully  including risks on myocardial infarction, death, stroke, bleeding, arrhythmia, dye allergy, renal insufficiency or bleeding.  All patient questions and concerns were discussed and the patient understands and is willing to proceed.     3.  Tobacco abuse  Patient will need smoking cessation counseling.   4.  Mildly elevated BP - continue to watch.  Cannot increase BB further due borderline low HR.  May consider adding ACE I after cath.      Quintella Reichert, MD  08/12/2014  11:38 AM

## 2014-08-12 NOTE — Progress Notes (Signed)
ANTICOAGULATION CONSULT NOTE - Follow Up Consult  Pharmacy Consult for Heparin Indication: chest pain/ACS  No Known Allergies  Patient Measurements: Height: 5' (152.4 cm) Weight: 145 lb 3.2 oz (65.862 kg) IBW/kg (Calculated) : 45.5 Heparin Dosing Weight: 58 kg  Vital Signs: Temp: 97.7 F (36.5 C) (03/20 0837) Temp Source: Oral (03/20 0837) BP: 154/75 mmHg (03/20 0837) Pulse Rate: 59 (03/20 0837)  Labs:  Recent Labs  08/11/14 0812 08/11/14 0925 08/11/14 1142 08/11/14 1741 08/11/14 1825 08/12/14 0150 08/12/14 0350 08/12/14 0852  HGB 14.9  --   --   --   --  12.3  --   --   HCT 43.5  --   --   --   --  36.5  --   --   PLT 281  --   --   --   --  233  --   --   APTT 31  --   --   --   --   --   --   --   LABPROT 13.1  --   --   --   --   --   --   --   INR 0.98  --   --   --   --   --   --   --   HEPARINUNFRC  --   --   --   --  0.33  --   --  0.36  CREATININE  --  0.68  --   --   --  0.76  --   --   TROPONINI  --   --  0.23* 0.15*  --   --  0.05*  --     Estimated Creatinine Clearance: 62.6 mL/min (by C-G formula based on Cr of 0.76).   Medications:  Infusions:  . heparin 800 Units/hr (08/11/14 1143)  . nitroGLYCERIN 8 mcg/min (08/12/14 0547)    Assessment: Kim Brady who presented with chest pain x 1 month, worsening over last few days with pain radiating down both arms on IV heparin.  Heparin level today is therapeutic on 800 units/hr.  CBC is down slightly but within normal limits.  No bleeding reported.   Goal of Therapy:  Heparin level 0.3-0.7 units/ml Monitor platelets by anticoagulation protocol: Yes   Plan:  Continue Heparin at 800 units/hr. Daily heparin level and CBC.  Link SnufferJessica Andrez Lieurance, PharmD, BCPS Clinical Pharmacist [phone] 08/12/2014,10:18 AM

## 2014-08-12 NOTE — Progress Notes (Signed)
UR completed 

## 2014-08-12 NOTE — Progress Notes (Signed)
[facility] PROGRESS NOTE   Cannon KettleVanessa L Beck WGN:[id number] DOB: [birth year] DOA: 08/11/2014 PCP: No PCP Per Patient  HPI/Subjective: Patient denies any chest pain mild tenderness in the middle of the chest.  Assessment/Plan: Active Problems:   Elevated blood pressure reading   Elevated troponin   Leukopenia   Non-STEMI (non-ST elevated myocardial infarction)   Tobacco abuse   Non-STEMI Patient presented with elevated troponin, treated currently has none STEMI. Patient is on heparin drip, nitro drip, aspirin and beta blockers. Troponin trending down, last troponin was 0.05. Check FLP. Cardiology evaluated the patient, patient for cardiac cath in the morning unless chest pain recurs might need emergent catheterization.  Tobacco abuse Counseled about tobacco use.    Code Status: Full Code Family Communication: Plan discussed with the patient. Disposition Plan: Remains inpatient Diet: Diet Heart  Consultants:  Cardiology  Procedures:  None  Antibiotics:  None   Objective: Filed Vitals:   08/12/14 1300  BP: 136/63  Pulse:   Temp:   Resp: 21    Intake/Output Summary (Last 24 hours) at 08/12/14 1354 Last data filed at 08/11/14 2237  Gross per 24 hour  Intake      0 ml  Output    400 ml  Net   -400 ml   Filed Weights   08/11/14 1117 08/11/14 2039 08/12/14 0518  Weight: 65.772 kg (145 lb) 66.134 kg (145 lb 12.8 oz) 65.862 kg (145 lb 3.2 oz)    Exam: General: Alert and awake, oriented x3, not in any acute distress. HEENT: anicteric sclera, pupils reactive to light and accommodation, EOMI CVS: S1-S2 clear, no murmur rubs or gallops Chest: clear to auscultation bilaterally, no wheezing, rales or rhonchi Abdomen: soft nontender, nondistended, normal bowel sounds, no organomegaly Extremities: no cyanosis, clubbing or edema noted bilaterally Neuro: Cranial nerves II-XII intact, no focal neurological deficits  Data Reviewed: Basic Metabolic  Panel:  Recent Labs Lab 08/11/14 0925 08/12/14 0150  NA 138 139  K 4.0 3.5  CL 107 107  CO2 21 25  GLUCOSE 107* 102*  BUN 14 9  CREATININE 0.68 0.76  CALCIUM 9.9 9.5   Liver Function Tests: No results for input(s): AST, ALT, ALKPHOS, BILITOT, PROT, ALBUMIN in the last 168 hours. No results for input(s): LIPASE, AMYLASE in the last 168 hours. No results for input(s): AMMONIA in the last 168 hours. CBC:  Recent Labs Lab 08/11/14 0812 08/12/14 0150  WBC 3.9* 5.3  HGB 14.9 12.3  HCT 43.5 36.5  MCV 91.4 91.9  PLT 281 233   Cardiac Enzymes:  Recent Labs Lab 08/11/14 1142 08/11/14 1741 08/12/14 0350  TROPONINI 0.23* 0.15* 0.05*   BNP (last 3 results) No results for input(s): BNP in the last 8760 hours.  ProBNP (last 3 results) No results for input(s): PROBNP in the last 8760 hours.  CBG: No results for input(s): GLUCAP in the last 168 hours.  Micro No results found for this or any previous visit (from the past 240 hour(s)).   Studies: Dg Chest 2 View  08/11/2014   CLINICAL DATA:  Midline chest pain  EXAM: CHEST  2 VIEW  COMPARISON:  None.  FINDINGS: The heart size and mediastinal contours are within normal limits. Both lungs are clear. The visualized skeletal structures show degenerative change of thoracic spine.  IMPRESSION: No active cardiopulmonary disease.   Electronically Signed   By: Alcide CleverMark  Lukens M.D.   On: 08/11/2014 08:44    Scheduled Meds: . aspirin EC  81 mg Oral  Daily  . atorvastatin  40 mg Oral q1800  . metoprolol tartrate  12.5 mg Oral BID  . sodium chloride  3 mL Intravenous Q12H   Continuous Infusions: . [START ON 08/13/2014] sodium chloride    . heparin 800 Units/hr (08/11/14 1143)  . nitroGLYCERIN 8 mcg/min (08/12/14 0547)       Time spent: 35 minutes    [facility] A  [facility] Pager [phone] If 7PM-7AM, please contact night-coverage at [email or web address], password [facility] 08/12/2014, 1:54 PM

## 2014-08-12 NOTE — Progress Notes (Addendum)
SUBJECTIVE:  Denies any further CP  OBJECTIVE:   Vitals:   Filed Vitals:   08/12/14 0254 08/12/14 0518 08/12/14 0546 08/12/14 0837  BP: 120/69 151/76 151/74 154/75  Pulse:    59  Temp:  98.3 F (36.8 C)  97.7 F (36.5 C)  TempSrc:    Oral  Resp: 14 21 15 15   Height:      Weight:  145 lb 3.2 oz (65.862 kg)    SpO2:  98%  100%   I&O's:   Intake/Output Summary (Last 24 hours) at 08/12/14 1027 Last data filed at 08/11/14 2237  Gross per 24 hour  Intake      0 ml  Output    400 ml  Net   -400 ml   TELEMETRY: Reviewed telemetry pt in NSR:     PHYSICAL EXAM General: Well developed, well nourished, in no acute distress Head: Eyes PERRLA, No xanthomas.   Normal cephalic and atramatic  Lungs:  Clear bilaterally to auscultation and percussion. Heart:   HRRR S1 S2 Pulses are 2+ & equal. Abdomen: Bowel sounds are positive, abdomen soft and non-tender without masses Extremities:  No clubbing, cyanosis or edema.  DP +1 Neuro: Alert and oriented X 3. Psych:  Good affect, responds appropriately   LABS: Basic Metabolic Panel:  Recent Labs  16/02/9602/19/16 0925 08/12/14 0150  NA 138 139  K 4.0 3.5  CL 107 107  CO2 21 25  GLUCOSE 107* 102*  BUN 14 9  CREATININE 0.68 0.76  CALCIUM 9.9 9.5   Liver Function Tests: No results for input(s): AST, ALT, ALKPHOS, BILITOT, PROT, ALBUMIN in the last 72 hours. No results for input(s): LIPASE, AMYLASE in the last 72 hours. CBC:  Recent Labs  08/11/14 0812 08/12/14 0150  WBC 3.9* 5.3  HGB 14.9 12.3  HCT 43.5 36.5  MCV 91.4 91.9  PLT 281 233   Cardiac Enzymes:  Recent Labs  08/11/14 1142 08/11/14 1741 08/12/14 0350  TROPONINI 0.23* 0.15* 0.05*   BNP: Invalid input(s): POCBNP D-Dimer: No results for input(s): DDIMER in the last 72 hours. Hemoglobin A1C: No results for input(s): HGBA1C in the last 72 hours. Fasting Lipid Panel: No results for input(s): CHOL, HDL, LDLCALC, TRIG, CHOLHDL, LDLDIRECT in the last 72  hours. Thyroid Function Tests: No results for input(s): TSH, T4TOTAL, T3FREE, THYROIDAB in the last 72 hours.  Invalid input(s): FREET3 Anemia Panel: No results for input(s): VITAMINB12, FOLATE, FERRITIN, TIBC, IRON, RETICCTPCT in the last 72 hours. Coag Panel:   Lab Results  Component Value Date   INR 0.98 08/11/2014    RADIOLOGY: Dg Chest 2 View  08/11/2014   CLINICAL DATA:  Midline chest pain  EXAM: CHEST  2 VIEW  COMPARISON:  None.  FINDINGS: The heart size and mediastinal contours are within normal limits. Both lungs are clear. The visualized skeletal structures show degenerative change of thoracic spine.  IMPRESSION: No active cardiopulmonary disease.   Electronically Signed   By: Alcide CleverMark  Lukens M.D.   On: 08/11/2014 08:44    Impression and Recommendations  1.  Leukopenia  2.  Non-STEMI (non-ST elevated myocardial infarction)  The patient's symptoms and enzymes are consistent with non-STEMI.  She is pain-free currently. She is on heparin and nitroglycerin. Continue  Aspirin/statin/bb.  We will watch her carefully. If return of chest discomfort we will proceed with catheterization on an urgent basis. Otherwise we will plan for elective catheterization on Monday.  3.  Tobacco abuse  Patient will need smoking  cessation counseling.   4.  Mildly elevated BP - continue to watch.  Cannot increase BB further due borderline low HR.  May consider adding ACE I after cath.      Quintella Reichert, MD  08/12/2014  10:27 AM

## 2014-08-12 NOTE — Progress Notes (Signed)
Pt's BP currently 102/66, no chest pain, spoke with Elray McgregorMary Lynch about continuing the [employer] Gtt which is currently at 503mcg/hr. Will continue to keep SBP below 160 and stop if patient drops below 90.

## 2014-08-13 ENCOUNTER — Encounter (HOSPITAL_COMMUNITY)
Admission: EM | Disposition: A | Discharge status: Home / Self Care | Payer: Self-pay | Source: Home / Self Care | Attending: Internal Medicine

## 2014-08-13 ENCOUNTER — Encounter (HOSPITAL_COMMUNITY): Payer: Self-pay | Admitting: Cardiovascular Disease

## 2014-08-13 DIAGNOSIS — R079 Chest pain, unspecified: Secondary | ICD-10-CM

## 2014-08-13 DIAGNOSIS — I25110 Atherosclerotic heart disease of native coronary artery with unstable angina pectoris: Secondary | ICD-10-CM

## 2014-08-13 HISTORY — PX: LEFT HEART CATHETERIZATION WITH CORONARY ANGIOGRAM: SHX5451

## 2014-08-13 LAB — BASIC METABOLIC PANEL
ANION GAP: 9 (ref 5–15)
BUN: 12 mg/dL (ref 6–23)
CHLORIDE: 109 mmol/L (ref 96–112)
CO2: 22 mmol/L (ref 19–32)
Calcium: 9.8 mg/dL (ref 8.4–10.5)
Creatinine, Ser: 0.8 mg/dL (ref 0.50–1.10)
GFR calc Af Amer: >90 mL/min (ref >90)
GFR calc non Af Amer: 78 mL/min — ABNORMAL LOW (ref >90)
GLUCOSE: 102 mg/dL — AB (ref 70–99)
POTASSIUM: 4 mmol/L (ref 3.5–5.1)
SODIUM: 140 mmol/L (ref 135–145)

## 2014-08-13 LAB — CBC
HEMATOCRIT: 38.3 % (ref 36.0–46.0)
HEMOGLOBIN: 12.9 g/dL (ref 12.0–15.0)
MCH: 31.2 pg (ref 26.0–34.0)
MCHC: 33.7 g/dL (ref 30.0–36.0)
MCV: 92.7 fL (ref 78.0–100.0)
Platelets: 248 10*3/uL (ref 150–400)
RBC: 4.13 MIL/uL (ref 3.87–5.11)
RDW: 14.5 % (ref 11.5–15.5)
WBC: 5.2 10*3/uL (ref 4.0–10.5)

## 2014-08-13 LAB — HEPARIN LEVEL (UNFRACTIONATED): HEPARIN UNFRACTIONATED: 0.38 [IU]/mL (ref 0.30–0.70)

## 2014-08-13 LAB — CK TOTAL AND CKMB (NOT AT ARMC)
CK, MB: 1.4 ng/mL (ref 0.3–4.0)
Relative Index: INVALID (ref 0.0–2.5)
Total CK: 51 U/L (ref 7–177)

## 2014-08-13 LAB — PROTIME-INR
INR: 1.01 (ref 0.00–1.49)
Prothrombin Time: 13.4 seconds (ref 11.6–15.2)

## 2014-08-13 LAB — POCT ACTIVATED CLOTTING TIME: Activated Clotting Time: 331 seconds

## 2014-08-13 SURGERY — LEFT HEART CATHETERIZATION WITH CORONARY ANGIOGRAM
Anesthesia: LOCAL

## 2014-08-13 MED ORDER — SODIUM CHLORIDE 0.9 % IV SOLN
INTRAVENOUS | Status: AC
  Administered 2014-08-13: 21:00:00 via INTRAVENOUS

## 2014-08-13 MED ORDER — VERAPAMIL HCL 2.5 MG/ML IV SOLN
INTRAVENOUS | Status: AC
  Filled 2014-08-13: qty 2

## 2014-08-13 MED ORDER — TICAGRELOR 90 MG PO TABS
90.0000 mg | ORAL_TABLET | Freq: Two times a day (BID) | ORAL | Status: DC
  Administered 2014-08-14: 90 mg via ORAL
  Filled 2014-08-13 (×2): qty 1

## 2014-08-13 MED ORDER — ALPRAZOLAM 0.5 MG PO TABS
0.5000 mg | ORAL_TABLET | Freq: Three times a day (TID) | ORAL | Status: DC | PRN
  Administered 2014-08-13: 0.5 mg via ORAL
  Filled 2014-08-13: qty 1

## 2014-08-13 MED ORDER — ACETAMINOPHEN 325 MG PO TABS: 650.0000 mg | ORAL_TABLET | ORAL | Status: DC | PRN

## 2014-08-13 MED ORDER — LISINOPRIL 10 MG PO TABS
10.0000 mg | ORAL_TABLET | Freq: Every day | ORAL | Status: DC
  Administered 2014-08-13 – 2014-08-14 (×2): 10 mg via ORAL
  Filled 2014-08-13 (×2): qty 1

## 2014-08-13 MED ORDER — HEPARIN SODIUM (PORCINE) 1000 UNIT/ML IJ SOLN
INTRAMUSCULAR | Status: AC
  Filled 2014-08-13: qty 1

## 2014-08-13 MED ORDER — FENTANYL CITRATE 0.05 MG/ML IJ SOLN
INTRAMUSCULAR | Status: AC
  Filled 2014-08-13: qty 2

## 2014-08-13 MED ORDER — TICAGRELOR 90 MG PO TABS
ORAL_TABLET | ORAL | Status: AC
  Filled 2014-08-13: qty 1

## 2014-08-13 MED ORDER — LIDOCAINE HCL (PF) 1 % IJ SOLN
INTRAMUSCULAR | Status: AC
  Filled 2014-08-13: qty 30

## 2014-08-13 MED ORDER — HEPARIN (PORCINE) IN NACL 2-0.9 UNIT/ML-% IJ SOLN
INTRAMUSCULAR | Status: AC
  Filled 2014-08-13: qty 1000

## 2014-08-13 MED ORDER — NITROGLYCERIN 1 MG/10 ML FOR IR/CATH LAB
INTRA_ARTERIAL | Status: AC
  Filled 2014-08-13: qty 10

## 2014-08-13 MED ORDER — MIDAZOLAM HCL 2 MG/2ML IJ SOLN
INTRAMUSCULAR | Status: AC
  Filled 2014-08-13: qty 2

## 2014-08-13 MED ORDER — ONDANSETRON HCL 4 MG/2ML IJ SOLN: 4.0000 mg | Freq: Four times a day (QID) | INTRAMUSCULAR | Status: DC | PRN

## 2014-08-13 NOTE — Progress Notes (Signed)
TR BAND REMOVAL  LOCATION:    right radial  DEFLATED PER PROTOCOL:    Yes.    TIME BAND OFF / DRESSING APPLIED:    2100   SITE UPON ARRIVAL:    Level 0  SITE AFTER BAND REMOVAL:    Level 0  CIRCULATION SENSATION AND MOVEMENT:    Within Normal Limits   Yes.    COMMENTS:   Rechecked at 2130 with no change in assessment

## 2014-08-13 NOTE — Progress Notes (Signed)
Subjective: No CP or SOB  Objective: Vital signs in last 24 hours: Temp:  [97.5 F (36.4 C)-98.7 F (37.1 C)] 98.4 F (36.9 C) (03/21 0751) Pulse Rate:  [55-64] 58 (03/21 0939) Resp:  [12-21] 21 (03/21 0407) BP: (99-157)/(56-81) 149/78 mmHg (03/21 0939) SpO2:  [96 %-99 %] 99 % (03/21 0407) Weight:  [135 lb 6.4 oz (61.417 kg)] 135 lb 6.4 oz (61.417 kg) (03/21 0423) Last BM Date: 08/12/14  Intake/Output from previous day: 03/20 0701 - 03/21 0700 In: 124.8 [I.V.:124.8] Out: 1150 [Urine:1150] Intake/Output this shift: Total I/O In: 3 [I.V.:3] Out: 400 [Urine:400]  Medications Current Facility-Administered Medications  Medication Dose Route Frequency Provider Last Rate Last Dose  . 0.9 %  sodium chloride infusion  250 mL Intravenous PRN Marinda ElkAbraham Feliz Ortiz, MD      . 0.9 %  sodium chloride infusion  250 mL Intravenous PRN Quintella Reichertraci R Turner, MD      . 0.9 %  sodium chloride infusion   Intravenous Continuous Quintella Reichertraci R Turner, MD 50 mL/hr at 08/13/14 0420    . acetaminophen (TYLENOL) tablet 650 mg  650 mg Oral Q4H PRN Marinda ElkAbraham Feliz Ortiz, MD   650 mg at 08/12/14 1817  . aspirin EC tablet 81 mg  81 mg Oral Daily Marinda ElkAbraham Feliz Ortiz, MD   Stopped at 08/13/14 1000  . atorvastatin (LIPITOR) tablet 40 mg  40 mg Oral q1800 Luis AbedJeffrey D Katz, MD   40 mg at 08/12/14 1712  . heparin ADULT infusion 100 units/mL (25000 units/250 mL)  800 Units/hr Intravenous Continuous Teressa Lowermanda M Runyon, RPH 8 mL/hr at 08/12/14 1814 800 Units/hr at 08/12/14 1814  . metoprolol tartrate (LOPRESSOR) tablet 12.5 mg  12.5 mg Oral BID Marinda ElkAbraham Feliz Ortiz, MD   12.5 mg at 08/13/14 [id number]  . nitroGLYCERIN 50 mg in dextrose 5 % 250 mL (0.2 mg/mL) infusion  3-30 mcg/min Intravenous Titrated Marinda ElkAbraham Feliz Ortiz, MD 2.4 mL/hr at 08/12/14 0547 8 mcg/min at 08/12/14 0547  . ondansetron (ZOFRAN) injection 4 mg  4 mg Intravenous Q6H PRN Marinda ElkAbraham Feliz Ortiz, MD      . sodium chloride 0.9 % injection 3 mL  3 mL Intravenous Q12H Marinda ElkAbraham  Feliz Ortiz, MD   3 mL at 08/13/14 0942  . sodium chloride 0.9 % injection 3 mL  3 mL Intravenous PRN Marinda ElkAbraham Feliz Ortiz, MD      . sodium chloride 0.9 % injection 3 mL  3 mL Intravenous Q12H Quintella Reichertraci R Turner, MD   3 mL at 08/13/14 0944  . sodium chloride 0.9 % injection 3 mL  3 mL Intravenous PRN Quintella Reichertraci R Turner, MD        PE: General appearance: alert, cooperative and no distress Lungs: clear to auscultation bilaterally Heart: regular rate and rhythm, S1, S2 normal, no murmur, click, rub or gallop Extremities: No LEE Pulses: 2+ radials and left DP.  1+ right DP Skin: Warm and dry Neurologic: Grossly normal  Lab Results:   Recent Labs  08/11/14 0812 08/12/14 0150 08/13/14 0418  WBC 3.9* 5.3 5.2  HGB 14.9 12.3 12.9  HCT 43.5 36.5 38.3  PLT 281 233 248   BMET  Recent Labs  08/11/14 0925 08/12/14 0150 08/13/14 0418  NA 138 139 140  K 4.0 3.5 4.0  CL 107 107 109  CO2 21 25 22   GLUCOSE 107* 102* 102*  BUN 14 9 12   CREATININE 0.68 0.76 0.80  CALCIUM 9.9 9.5 9.8   PT/INR  Recent Labs  08/11/14 0812 08/13/14 0418  LABPROT 13.1 13.4  INR 0.98 1.01   Cardiac Panel (last 3 results)  Recent Labs  08/11/14 1142 08/11/14 1741 08/12/14 0350  TROPONINI 0.23* 0.15* 0.05*      Assessment/Plan  Active Problems:   NSTEMI (non-ST elevated myocardial infarction)  No further CP.  Troponin 0.23.   On heparin and NTG.  ASA, statin, BB.  Left heart cath today.     Elevated blood pressure reading   Leukopenia   Tobacco abuse  Cessation discussed.   LOS: 1 day    HAGER, BRYAN PA-C 08/13/2014 10:16 AM  I have seen and examined the patient along with HAGER, BRYAN PA-C.  I have reviewed the chart, notes and new data.  I agree with PA's note.   PLAN: Cardiac cath and revascularization as appropriate today.  Thurmon Fair, MD, [facility] [phone] 08/13/2014, 11:28 AM

## 2014-08-13 NOTE — Progress Notes (Signed)
  Echocardiogram 2D Echocardiogram has been performed.  Kim BeamsGREGORY, Kim Brady 08/13/2014, 11:54 AM

## 2014-08-13 NOTE — Interval H&P Note (Signed)
History and Physical Interval Note:  08/13/2014 1:15 PM  Cannon KettleVanessa L Purnell  has presented today for cardiac cath with the diagnosis of NSTEMI. The various methods of treatment have been discussed with the patient and family. After consideration of risks, benefits and other options for treatment, the patient has consented to  Procedure(s): LEFT HEART CATHETERIZATION WITH CORONARY ANGIOGRAM (N/A) as a surgical intervention .  The patient's history has been reviewed, patient examined, no change in status, stable for surgery.  I have reviewed the patient's chart and labs.  Questions were answered to the patient's satisfaction.    Cath Lab Visit (complete for each Cath Lab visit)  Clinical Evaluation Leading to the Procedure:   ACS: Yes.    Non-ACS:    Anginal Classification: CCS IV  Anti-ischemic medical therapy: Minimal Therapy (1 class of medications)  Non-Invasive Test Results: No non-invasive testing performed  Prior CABG: No previous CABG         MCALHANY,CHRISTOPHER

## 2014-08-13 NOTE — Progress Notes (Signed)
[facility] PROGRESS NOTE   Kim Brady ZOX:[id number] DOB: Oct 24, 1955 DOA: 08/11/2014 PCP: No PCP Per Patient  HPI/Subjective: Denies any chest pain, denies any other complaints.  Assessment/Plan: Active Problems:   NSTEMI (non-ST elevated myocardial infarction)   Elevated blood pressure reading   Elevated troponin   Leukopenia   Non-STEMI (non-ST elevated myocardial infarction)   Tobacco abuse   Non-STEMI Patient presented with elevated troponin, treated currently has none STEMI. Patient is on heparin drip, nitro drip, aspirin and beta blockers. Troponin trending down, last troponin was 0.05. Check FLP. Cardiac cath done earlier today and showed severe single-vessel disease to the mid RCA. DS 1 placed in the mid RCA. Antiplatelets recommendation ASA and Brilinta.  HTN Patient is on lisinopril 12.5 mg twice a day, lisinopril 10 mg added.  Tobacco abuse Counseled about tobacco use.    Code Status: Full Code Family Communication: Plan discussed with the patient. Disposition Plan: Remains inpatient Diet: Diet Heart  Consultants:  Cardiology  Procedures:  None  Antibiotics:  None   Objective: Filed Vitals:   08/13/14 1607  BP: 134/68  Pulse: 63  Temp: 98.3 F (36.8 C)  Resp: 19    Intake/Output Summary (Last 24 hours) at 08/13/14 1809 Last data filed at 08/13/14 1608  Gross per 24 hour  Intake    243 ml  Output   1675 ml  Net  -1432 ml   Filed Weights   08/11/14 2039 08/12/14 0518 08/13/14 0423  Weight: 66.134 kg (145 lb 12.8 oz) 65.862 kg (145 lb 3.2 oz) 61.417 kg (135 lb 6.4 oz)    Exam: General: Alert and awake, oriented x3, not in any acute distress. HEENT: anicteric sclera, pupils reactive to light and accommodation, EOMI CVS: S1-S2 clear, no murmur rubs or gallops Chest: clear to auscultation bilaterally, no wheezing, rales or rhonchi Abdomen: soft nontender, nondistended, normal bowel sounds, no  organomegaly Extremities: no cyanosis, clubbing or edema noted bilaterally Neuro: Cranial nerves II-XII intact, no focal neurological deficits  Data Reviewed: Basic Metabolic Panel:  Recent Labs Lab 08/11/14 0925 08/12/14 0150 08/13/14 0418  NA 138 139 140  K 4.0 3.5 4.0  CL 107 107 109  CO2 GLUCOSE 107* 102* 102*  BUN CREATININE 0.68 0.76 0.80  CALCIUM 9.9 9.5 9.8   Liver Function Tests: No results for input(s): AST, ALT, ALKPHOS, BILITOT, PROT, ALBUMIN in the last 168 hours. No results for input(s): LIPASE, AMYLASE in the last 168 hours. No results for input(s): AMMONIA in the last 168 hours. CBC:  Recent Labs Lab 08/11/14 0812 08/12/14 0150 08/13/14 0418  WBC 3.9* 5.3 5.2  HGB 14.9 12.3 12.9  HCT 43.5 36.5 38.3  MCV 91.4 91.9 92.7  PLT 281 233 248   Cardiac Enzymes:  Recent Labs Lab 08/11/14 1142 08/11/14 1741 08/12/14 0350 08/13/14 0418  CKTOTAL  --   --   --  51  CKMB  --   --   --  1.4  TROPONINI 0.23* 0.15* 0.05*  --    BNP (last 3 results) No results for input(s): BNP in the last 8760 hours.  ProBNP (last 3 results) No results for input(s): PROBNP in the last 8760 hours.  CBG: No results for input(s): GLUCAP in the last 168 hours.  Micro No results found for this or any previous visit (from the past 240 hour(s)).   Studies: No results found.  Scheduled Meds: . aspirin EC  81 mg Oral  Daily  . atorvastatin  40 mg Oral q1800  . lisinopril  10 mg Oral Daily  . metoprolol tartrate  12.5 mg Oral BID  . sodium chloride  3 mL Intravenous Q12H  . ticagrelor  90 mg Oral BID   Continuous Infusions: . sodium chloride 75 mL/hr at 08/13/14 1443  . nitroGLYCERIN 8 mcg/min (08/12/14 0547)       Time spent: 35 minutes    Beverly HospitalELMAHI,Kim Brady A  [facility] Pager [phone] If 7PM-7AM, please contact night-coverage at [email or web address], password [email or web address] 08/13/2014, 6:09 PM  LOS: 1 day

## 2014-08-13 NOTE — CV Procedure (Signed)
Cardiac Catheterization Operative Report  Kim Brady [id number] 3/21/20162:24 PM No PCP Per Patient  Procedure Performed:  1. Left Heart Catheterization 2. Selective Coronary Angiography 3. Left ventricular angiogram 4. PTCA/DES x 1 mid RCA  Operator: Verne Carrow, MD  Arterial access site:  Right radial artery.   Indication:  59 yo female with history of tobacco abuse, HTN admitted with NSTEMI.                                     Procedure Details: The risks, benefits, complications, treatment options, and expected outcomes were discussed with the patient. The patient and/or family concurred with the proposed plan, giving informed consent. The patient was brought to the cath lab after IV hydration was begun and oral premedication was given. The patient was further sedated with Versed and Fentanyl. The right wrist was assessed with a modified Allens test which was positive. The right wrist was prepped and draped in a sterile fashion. 1% lidocaine was used for local anesthesia. Using the modified Seldinger access technique, a 5 French sheath was placed in the right radial artery. 3 mg Verapamil was given through the sheath. 35000 units IV heparin was given. Standard diagnostic catheters were used to perform selective coronary angiography. A pigtail catheter was used to perform a left ventricular angiogram. She was found to have severe stenosis in the mid RCA. I elected to proceed to PCI of the RCA.   PCI Note: She was given an additional 5500 units IV heparin. She was given Brilinta 180 mg po x 1. When the ACT was over 200, I engaged the RCA with a JR4 guiding catheter. She was randomized into the Bioflow research trial. IC NTG given. A 2.5 x 20 mm balloon was used to pre-dilate the mid RCA stenosis. A 4.0 x 40 mm Orsiro DES was deployed in the mid RCA. The stent was post-dilated with a 4.0 x 20 mm [place] balloon x 3. The stenosis was taken from 99% down to 0%.   The sheath  was removed from the right radial artery and a Terumo hemostasis band was applied at the arteriotomy site on the right wrist. There were no immediate complications. The patient was taken to the recovery area in stable condition.   Hemodynamic Findings: Central aortic pressure: 196/91 Left ventricular pressure: 196/11/25  Angiographic Findings:  Left main: No obstructive disease.   Left Anterior Descending Artery: Large caliber vessel that courses to the apex. The proximal vessel has diffuse 20% stenosis. The mid vessel has diffuse 40% stenosis. The distal vessel tapers to a small caliber vessel. The moderate caliber diagonal branch has proximal 40% stenosis.   Circumflex Artery: Moderate caliber vessel with termination into a moderate caliber obtuse marginal branch. The mid Circumflex has diffuse 30% stenosis.   Right Coronary Artery: Large dominant vessel with mid 99% stenosis followed by a 80% stenosis in the mid vessel. The moderate caliber PDA has no obstructive disease. The small sub-branch of the posterolateral branch has diffuse 70% stenosis, too small for PCI.   Left Ventricular Angiogram: LVEF=65%.   Impression: 1. 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 Mild to moderate non-obstructive disease in the LAD and Circumflex 3. Unstable angina/NSTEMI 4. Successful PTCA/DES x 1 mid RCA 5. Preserved LV systolic function 6. Uncontrolled HTN  Recommendations: Will continue ASA and Brilinta for one year. Will add Ace-inh  for better BP control.        Complications:  None. The patient tolerated the procedure well.

## 2014-08-13 NOTE — H&P (View-Only) (Signed)
  Subjective: No CP or SOB  Objective: Vital signs in last 24 hours: Temp:  [97.5 F (36.4 C)-98.7 F (37.1 C)] 98.4 F (36.9 C) (03/21 0751) Pulse Rate:  [55-64] 58 (03/21 0939) Resp:  [12-21] 21 (03/21 0407) BP: (99-157)/(56-81) 149/78 mmHg (03/21 0939) SpO2:  [96 %-99 %] 99 % (03/21 0407) Weight:  [135 lb 6.4 oz (61.417 kg)] 135 lb 6.4 oz (61.417 kg) (03/21 0423) Last BM Date: 08/12/14  Intake/Output from previous day: 03/20 0701 - 03/21 0700 In: 124.8 [I.V.:124.8] Out: 1150 [Urine:1150] Intake/Output this shift: Total I/O In: 3 [I.V.:3] Out: 400 [Urine:400]  Medications Current Facility-Administered Medications  Medication Dose Route Frequency Provider Last Rate Last Dose  . 0.9 %  sodium chloride infusion  250 mL Intravenous PRN Abraham Feliz Ortiz, MD      . 0.9 %  sodium chloride infusion  250 mL Intravenous PRN Traci R Turner, MD      . 0.9 %  sodium chloride infusion   Intravenous Continuous Traci R Turner, MD 50 mL/hr at 08/13/14 0420    . acetaminophen (TYLENOL) tablet 650 mg  650 mg Oral Q4H PRN Abraham Feliz Ortiz, MD   650 mg at 08/12/14 1817  . aspirin EC tablet 81 mg  81 mg Oral Daily Abraham Feliz Ortiz, MD   Stopped at 08/13/14 1000  . atorvastatin (LIPITOR) tablet 40 mg  40 mg Oral q1800 Jeffrey D Katz, MD   40 mg at 08/12/14 1712  . heparin ADULT infusion 100 units/mL (25000 units/250 mL)  800 Units/hr Intravenous Continuous Amanda M Runyon, RPH 8 mL/hr at 08/12/14 1814 800 Units/hr at 08/12/14 1814  . metoprolol tartrate (LOPRESSOR) tablet 12.5 mg  12.5 mg Oral BID Abraham Feliz Ortiz, MD   12.5 mg at 08/13/14 0939  . nitroGLYCERIN 50 mg in dextrose 5 % 250 mL (0.2 mg/mL) infusion  3-30 mcg/min Intravenous Titrated Abraham Feliz Ortiz, MD 2.4 mL/hr at 08/12/14 0547 8 mcg/min at 08/12/14 0547  . ondansetron (ZOFRAN) injection 4 mg  4 mg Intravenous Q6H PRN Abraham Feliz Ortiz, MD      . sodium chloride 0.9 % injection 3 mL  3 mL Intravenous Q12H Abraham  Feliz Ortiz, MD   3 mL at 08/13/14 0942  . sodium chloride 0.9 % injection 3 mL  3 mL Intravenous PRN Abraham Feliz Ortiz, MD      . sodium chloride 0.9 % injection 3 mL  3 mL Intravenous Q12H Traci R Turner, MD   3 mL at 08/13/14 0944  . sodium chloride 0.9 % injection 3 mL  3 mL Intravenous PRN Traci R Turner, MD        PE: General appearance: alert, cooperative and no distress Lungs: clear to auscultation bilaterally Heart: regular rate and rhythm, S1, S2 normal, no murmur, click, rub or gallop Extremities: No LEE Pulses: 2+ radials and left DP.  1+ right DP Skin: Warm and dry Neurologic: Grossly normal  Lab Results:   Recent Labs  08/11/14 0812 08/12/14 0150 08/13/14 0418  WBC 3.9* 5.3 5.2  HGB 14.9 12.3 12.9  HCT 43.5 36.5 38.3  PLT 281 233 248   BMET  Recent Labs  08/11/14 0925 08/12/14 0150 08/13/14 0418  NA 138 139 140  K 4.0 3.5 4.0  CL 107 107 109  CO2 21 25 22  GLUCOSE 107* 102* 102*  BUN 14 9 12  CREATININE 0.68 0.76 0.80  CALCIUM 9.9 9.5 9.8   PT/INR  Recent Labs    08/11/14 0812 08/13/14 0418  LABPROT 13.1 13.4  INR 0.98 1.01   Cardiac Panel (last 3 results)  Recent Labs  08/11/14 1142 08/11/14 1741 08/12/14 0350  TROPONINI 0.23* 0.15* 0.05*      Assessment/Plan  Active Problems:   NSTEMI (non-ST elevated myocardial infarction)  No further CP.  Troponin 0.23.   On heparin and NTG.  ASA, statin, BB.  Left heart cath today.     Elevated blood pressure reading   Leukopenia   Tobacco abuse  Cessation discussed.   LOS: 1 day    HAGER, BRYAN PA-C 08/13/2014 10:16 AM  I have seen and examined the patient along with HAGER, BRYAN PA-C.  I have reviewed the chart, notes and new data.  I agree with PA's note.   PLAN: Cardiac cath and revascularization as appropriate today.  Adelin Ventrella, MD, FACC [facility] [phone] 08/13/2014, 11:28 AM  

## 2014-08-13 NOTE — Research (Signed)
[place] Informed Consent   Subject Name: Kim Brady  Subject met inclusion and exclusion criteria.  The informed consent form, study requirements and expectations were reviewed with the subject and questions and concerns were addressed prior to the signing of the consent form.  The subject verbalized understanding of the trail requirements.  The subject agreed to participate in the BIOFLOW trial and signed the informed consent.  The informed consent was obtained prior to performance of any protocol-specific procedures for the subject.  A copy of the signed informed consent was given to the subject and a copy was placed in the subject's medical record.  Hedrick,Bronson Bressman W 08/13/2014, 0900

## 2014-08-13 NOTE — Progress Notes (Signed)
ANTICOAGULATION CONSULT NOTE - Follow Up Consult  Pharmacy Consult for Heparin Indication: chest pain/ACS  No Known Allergies  Patient Measurements: Height: 5' (152.4 cm) Weight: 135 lb 6.4 oz (61.417 kg) IBW/kg (Calculated) : 45.5 Heparin Dosing Weight: 58 kg  Vital Signs: Temp: 98.4 F (36.9 C) (03/21 0751) Temp Source: Oral (03/21 0751) BP: 128/81 mmHg (03/21 0407) Pulse Rate: 64 (03/21 0407)  Labs:  Recent Labs  08/11/14 [id number] 08/11/14 0925 08/11/14 1142 08/11/14 1741 08/11/14 1825 08/12/14 0150 08/12/14 0350 08/12/14 0852 08/13/14 0418  HGB 14.9  --   --   --   --  12.3  --   --  12.9  HCT 43.5  --   --   --   --  36.5  --   --  38.3  PLT 281  --   --   --   --  233  --   --  248  APTT 31  --   --   --   --   --   --   --   --   LABPROT 13.1  --   --   --   --   --   --   --  13.4  INR 0.98  --   --   --   --   --   --   --  1.01  HEPARINUNFRC  --   --   --   --  0.33  --   --  0.36 0.38  CREATININE  --  0.68  --   --   --  0.76  --   --  0.80  TROPONINI  --   --  0.23* 0.15*  --   --  0.05*  --   --     Estimated Creatinine Clearance: 60.5 mL/min (by C-G formula based on Cr of 0.8).   Medications:  Infusions:  . sodium chloride 50 mL/hr at 08/13/14 0420  . heparin 800 Units/hr (08/12/14 1814)  . nitroGLYCERIN 8 mcg/min (08/12/14 0547)    Assessment: [age] yoF who presented with chest pain x 1 month, worsening over last few days with pain radiating down both arms on IV heparin. She is noted with NSTEMI on heparin at 800 units/hr and heparin level is at goal (HL= 0.38). For cath today.    Goal of Therapy:  Heparin level 0.3-0.7 units/ml Monitor platelets by anticoagulation protocol: Yes   Plan:  -Continue Heparin at 800 units/hr. -Daily heparin level and CBC. -Will follow plans post cath  Harland Germanndrew Josep Luviano, Pharm D 08/13/2014 8:31 AM

## 2014-08-14 LAB — BASIC METABOLIC PANEL
ANION GAP: 8 (ref 5–15)
BUN: 12 mg/dL (ref 6–23)
CHLORIDE: 110 mmol/L (ref 96–112)
CO2: 22 mmol/L (ref 19–32)
CREATININE: 0.79 mg/dL (ref 0.50–1.10)
Calcium: 9.3 mg/dL (ref 8.4–10.5)
GFR calc non Af Amer: 88 mL/min — ABNORMAL LOW (ref >90)
Glucose, Bld: 100 mg/dL — ABNORMAL HIGH (ref 70–99)
POTASSIUM: 3.6 mmol/L (ref 3.5–5.1)
Sodium: 140 mmol/L (ref 135–145)

## 2014-08-14 LAB — CBC
HEMATOCRIT: 33.2 % — AB (ref 36.0–46.0)
Hemoglobin: 11.6 g/dL — ABNORMAL LOW (ref 12.0–15.0)
MCH: 31.6 pg (ref 26.0–34.0)
MCHC: 34.9 g/dL (ref 30.0–36.0)
MCV: 90.5 fL (ref 78.0–100.0)
Platelets: 213 10*3/uL (ref 150–400)
RBC: 3.67 MIL/uL — ABNORMAL LOW (ref 3.87–5.11)
RDW: 14.3 % (ref 11.5–15.5)
WBC: 5 10*3/uL (ref 4.0–10.5)

## 2014-08-14 LAB — HEMOGLOBIN A1C
Hgb A1c MFr Bld: 5.6 % (ref 4.8–5.6)
Mean Plasma Glucose: 114 mg/dL

## 2014-08-14 LAB — CK TOTAL AND CKMB (NOT AT ARMC)
CK, MB: 2.8 ng/mL (ref 0.3–4.0)
RELATIVE INDEX: INVALID (ref 0.0–2.5)
Total CK: 53 U/L (ref 7–177)

## 2014-08-14 MED ORDER — ASPIRIN 81 MG PO TBEC: 81.0000 mg | DELAYED_RELEASE_TABLET | Freq: Every day | ORAL | Status: DC

## 2014-08-14 MED ORDER — LISINOPRIL 10 MG PO TABS: 10.0000 mg | ORAL_TABLET | Freq: Every day | ORAL | Status: DC

## 2014-08-14 MED ORDER — TICAGRELOR 90 MG PO TABS: 90.0000 mg | ORAL_TABLET | Freq: Two times a day (BID) | ORAL | Status: DC

## 2014-08-14 MED ORDER — PRAVASTATIN SODIUM 40 MG PO TABS: 40.0000 mg | ORAL_TABLET | Freq: Every day | ORAL | Status: DC

## 2014-08-14 MED ORDER — METOPROLOL TARTRATE 25 MG PO TABS: 12.5000 mg | ORAL_TABLET | Freq: Two times a day (BID) | ORAL | Status: DC

## 2014-08-14 MED ORDER — ATORVASTATIN CALCIUM 40 MG PO TABS: 40.0000 mg | ORAL_TABLET | Freq: Every day | ORAL | Status: DC

## 2014-08-14 NOTE — Progress Notes (Signed)
CARDIAC REHAB PHASE I   PRE:  Rate/Rhythm: 73 SR  BP:  Supine: 149/72  Sitting:   Standing:    SaO2:   MODE:  Ambulation: 1000 ft   POST:  Rate/Rhythm: 78 SR  BP:  Supine:   Sitting: 172/78  Standing:    SaO2:  0915-1035 Pt tolerated ambulation well without c/o. Completed MI and stent education with pt. She voices understanding. Pt ask a lot of questions and needs reinforcement of information. Pt states that she plans to quit smoking cold [place]. I gave her smoking cessation information. She seems very motivated to quit smoking and to making changes. Pt agrees to Outpt. CRP in [place]. Will send referral.  Melina CopaLisa Jenetta Wease RN 08/14/2014 10:38 AM

## 2014-08-14 NOTE — Care Management Note (Signed)
    Page 1 of 1   08/14/2014     11:24:05 AM CARE MANAGEMENT NOTE 08/14/2014  Patient:  Kim Brady   Account Number:  [id number]  Date Initiated:  08/13/2014  Documentation initiated by:  COLE,ANGELA  Subjective/Objective Assessment:   PTA from home admitted with NSTENI.     Action/Plan:   Return to home when medically stable.   Anticipated DC Date:  08/14/2014   Anticipated DC Plan:  HOME/SELF CARE      DC Planning Services  CM consult  Follow-up appt scheduled  [facility]  Medication Assistance      Choice offered to / List presented to:             Status of service:  Completed, signed off Medicare Important Message given?  NO (If response is "NO", the following Medicare IM given date fields will be blank) Date Medicare IM given:   Medicare IM given by:   Date Additional Medicare IM given:   Additional Medicare IM given by:    Discharge Disposition:  HOME/SELF CARE  Per UR Regulation:  Reviewed for med. necessity/level of care/duration of stay  If discussed at Long Length of Stay Meetings, dates discussed:    Comments:  ,08/14/2014 @11 :00 Gae GallopAngela Cole RN, BSN, CM Pt with no insurance, Hosipital F/U set up with [facility], BRILINTA 30-DAY FREE CARD PROVIDED, Pt assistance form given to pt to fax to [facility]. No further needs for CM.   08/13/2014 @ 1545 Gae GallopAngela Cole RN,BSN,CM Benefit check initiated for brilinta 90mg  twice per day with [facility], wil make pt aware of copay.

## 2014-08-14 NOTE — Discharge Summary (Signed)
Physician Discharge Summary  Kim KettleVanessa L Freimuth EAV:[id number] DOB: 1955/10/26 DOA: 08/11/2014  PCP: No PCP Per Patient  Admit date: 08/11/2014 Discharge date: 08/14/2014  Time spent: 40 minutes  Recommendations for Outpatient Follow-up:  1. Follow-up with primary care physician within one week. 2. Follow-up with [facility] Theodore [facility]ark[facility]onda [facility]. 3. Started patient on Aspirin/Brilinta, reinforced the need for dual antiplatelet therapy.   Discharge Diagnoses:  Principal Problem:   Non-STEMI (non-ST elevated myocardial infarction) Active Problems:   NSTEMI (non-ST elevated myocardial infarction)   Elevated blood pressure reading   Elevated troponin   Leukopenia   Tobacco abuse   Discharge Condition: Stable  Diet recommendation: Heart healthy  Filed Weights   08/12/14 0518 08/13/14 0423 08/14/14 0010  Weight: 65.862 kg (145 lb 3.2 oz) 61.417 kg (135 lb 6.4 oz) 60 kg (132 lb 4.4 oz)    History of present illness:  Kim Brady is a 59 y.o. female with past medical history of tobacco abuse that comes in as she's been having chest pain intermittently for a month. She relates that a day prior to admission she started getting worst as it was not being relief with sitting down. She relates that the pain would be made worse by exertion when she would walk more than a block and made better with rest after 5 minutes. She relates is in her epigastric area and retrosternal area radiates to both arms. She gets short of breath when she get this but no sweating. She denies any nausea or vomiting. She denies the pain is a burning pain.  In the ED: EKG was done that shows normal sinus rhythm, with initial set of point of care markers of 0.11, revealed troponin 0.23 currently on IV nitroglycerin and heparin has remained chest pain-free.   Hospital Course:   Non-STEMI Patient presented with intermittent chest pain for a month. Patient presented with elevated troponin, treated  currently has none STEMI. Placed on heparin drip, nitro drip, aspirin and beta blockers. Troponin trending down, last troponin was 0.05. Cardiac cath done earlier today and showed severe single-vessel disease to the mid RCA. DES 1 placed in mid RCA. Antiplatelets recommendation ASA and Brilinta. Started on pravastatin 40 mg daily at bedtime.  HTN Patient is on lisinopril 12.5 mg twice a day, lisinopril 10 mg added.   Tobacco abuse Counseled about tobacco use.   Procedures:  Cardiac cath  Consultations:  Cardiology  Discharge Exam: Filed Vitals:   08/14/14 0813  BP: 118/40  Pulse: 63  Temp: 98.1 F (36.7 C)  Resp: 16   General: Alert and awake, oriented x3, not in any acute distress. HEENT: anicteric sclera, pupils reactive to light and accommodation, EOMI CVS: S1-S2 clear, no murmur rubs or gallops Chest: clear to auscultation bilaterally, no wheezing, rales or rhonchi Abdomen: soft nontender, nondistended, normal bowel sounds, no organomegaly Extremities: no cyanosis, clubbing or edema noted bilaterally Neuro: Cranial nerves II-XII intact, no focal neurological deficits  Discharge Instructions   Discharge Instructions    Diet - low sodium heart healthy    Complete by:  As directed      Increase activity slowly    Complete by:  As directed           Current Discharge Medication List    START taking these medications   Details  aspirin EC 81 MG EC tablet Take 1 tablet (81 mg total) by mouth daily. Qty: 30 tablet, Refills: 0    lisinopril (PRINIVIL,ZESTRIL) 10 MG tablet  Take 1 tablet (10 mg total) by mouth daily. Qty: 30 tablet, Refills: 0    metoprolol tartrate (LOPRESSOR) 25 MG tablet Take 0.5 tablets (12.5 mg total) by mouth 2 (two) times daily. Qty: 60 tablet, Refills: 0    pravastatin (PRAVACHOL) 40 MG tablet Take 1 tablet (40 mg total) by mouth daily. Qty: 30 tablet, Refills: 11    ticagrelor (BRILINTA) 90 MG TABS tablet Take 1 tablet (90 mg  total) by mouth 2 (two) times daily. Qty: 180 tablet, Refills: 3      CONTINUE these medications which have NOT CHANGED   Details  acetaminophen (TYLENOL) 500 MG tablet Take 1,000 mg by mouth every 6 (six) hours as needed for mild pain.    bismuth subsalicylate (PEPTO BISMOL) 262 MG/15ML suspension Take 30 mLs by mouth every 6 (six) hours as needed for indigestion.      STOP taking these medications     penicillin v potassium (VEETID) 500 MG tablet        No Known Allergies Follow-up Information    Follow up with Theodore Demark, PA-C On 08/31/2014.   Specialty:  Cardiology   Why:  1:00 PM   Contact information:   [address] [place] [address] [phone]       Follow up with [facility]     On 08/16/2014.   Why:   appt. @ 9:45am for PCP and medication assistance, please arrive with photo ID and $ 20.00 copay   Contact information:   [address] [facility] [address] [phone]       The results of significant diagnostics from this hospitalization (including imaging, microbiology, ancillary and laboratory) are listed below for reference.    Significant Diagnostic Studies: Dg Chest 2 View  08/11/2014   CLINICAL DATA:  Midline chest pain  EXAM: CHEST  2 VIEW  COMPARISON:  None.  FINDINGS: The heart size and mediastinal contours are within normal limits. Both lungs are clear. The visualized skeletal structures show degenerative change of thoracic spine.  IMPRESSION: No active cardiopulmonary disease.   Electronically Signed   By: Alcide Clever M.D.   On: 08/11/2014 08:44    Microbiology: No results found for this or any previous visit (from the past 240 hour(s)).   Labs: Basic Metabolic Panel:  Recent Labs Lab 08/11/14 0925 08/12/14 0150 08/13/14 0418 08/14/14 0338  NA 138 139 140 140  K 4.0 3.5 4.0 3.6  CL 107 107 109 110  CO2 GLUCOSE 107* 102* 102* 100*  BUN CREATININE 0.68 0.76 0.80 0.79  CALCIUM 9.9 9.5 9.8 9.3   Liver Function Tests: No results for input(s): AST, ALT, ALKPHOS, BILITOT, PROT, ALBUMIN in the last 168 hours. No results for input(s): LIPASE, AMYLASE in the last 168 hours. No results for input(s): AMMONIA in the last 168 hours. CBC:  Recent Labs Lab 08/11/14 0812 08/12/14 0150 08/13/14 0418 08/14/14 0338  WBC 3.9* 5.3 5.2 5.0  HGB 14.9 12.3 12.9 11.6*  HCT 43.5 36.5 38.3 33.2*  MCV 91.4 91.9 92.7 90.5  PLT 281 233 248 213   Cardiac Enzymes:  Recent Labs Lab 08/11/14 1142 08/11/14 1741 08/12/14 0350 08/13/14 0418 08/14/14 0338  CKTOTAL  --   --   --  51 53  CKMB  --   --   --  1.4 2.8  TROPONINI 0.23* 0.15* 0.05*  --   --  BNP: BNP (last 3 results) No results for input(s): BNP in the last 8760 hours.  ProBNP (last 3 results) No results for input(s): PROBNP in the last 8760 hours.  CBG: No results for input(s): GLUCAP in the last 168 hours.     Signed:  Bayleigh Loflin A  [facility] 08/14/2014, 11:58 AM

## 2014-08-14 NOTE — Progress Notes (Signed)
Subjective: She reports some SOB at times.   Objective: Vital signs in last 24 hours: Temp:  [97.6 F (36.4 C)-98.4 F (36.9 C)] 98.2 F (36.8 C) (03/22 0600) Pulse Rate:  [52-77] 72 (03/22 0600) Resp:  [14-36] 16 (03/22 0600) BP: (102-216)/(49-121) 104/49 mmHg (03/22 0600) SpO2:  [92 %-100 %] 99 % (03/22 0600) Weight:  [132 lb 4.4 oz (60 kg)] 132 lb 4.4 oz (60 kg) (03/22 0010) Last BM Date: 08/13/14  Intake/Output from previous day: 03/21 0701 - 03/22 0700 In: 1676.8 [P.O.:600; I.V.:1076.8] Out: 3425 [Urine:3425] Intake/Output this shift:    Medications Current Facility-Administered Medications  Medication Dose Route Frequency Provider Last Rate Last Dose  . 0.9 %  sodium chloride infusion  250 mL Intravenous PRN Marinda Elk, MD 10 mL/hr at 08/13/14 2100 250 mL at 08/13/14 2100  . acetaminophen (TYLENOL) tablet 650 mg  650 mg Oral Q4H PRN Marinda Elk, MD   650 mg at 08/12/14 1817  . acetaminophen (TYLENOL) tablet 650 mg  650 mg Oral Q4H PRN Kathleene Hazel, MD      . ALPRAZolam Prudy Feeler) tablet 0.5 mg  0.5 mg Oral TID PRN Janetta Hora, PA-C   0.5 mg at 08/13/14 1920  . aspirin EC tablet 81 mg  81 mg Oral Daily Marinda Elk, MD   Stopped at 08/13/14 1000  . atorvastatin (LIPITOR) tablet 40 mg  40 mg Oral q1800 Luis Abed, MD   40 mg at 08/13/14 1920  . lisinopril (PRINIVIL,ZESTRIL) tablet 10 mg  10 mg Oral Daily Kathleene Hazel, MD   10 mg at 08/13/14 1645  . metoprolol tartrate (LOPRESSOR) tablet 12.5 mg  12.5 mg Oral BID Marinda Elk, MD   12.5 mg at 08/13/14 2100  . nitroGLYCERIN 50 mg in dextrose 5 % 250 mL (0.2 mg/mL) infusion  3-30 mcg/min Intravenous Titrated Marinda Elk, MD   Stopped at 08/14/14 0130  . ondansetron (ZOFRAN) injection 4 mg  4 mg Intravenous Q6H PRN Marinda Elk, MD      . ondansetron [facility]) injection 4 mg  4 mg Intravenous Q6H PRN Kathleene Hazel, MD      . sodium chloride  0.9 % injection 3 mL  3 mL Intravenous Q12H Marinda Elk, MD   3 mL at 08/13/14 0942  . sodium chloride 0.9 % injection 3 mL  3 mL Intravenous PRN Marinda Elk, MD      . ticagrelor [facility]) tablet 90 mg  90 mg Oral BID Kathleene Hazel, MD   90 mg at 08/14/14 1610    PE: General appearance: alert, cooperative and no distress Lungs: clear to auscultation bilaterally Heart: regular rate and rhythm, S1, S2 normal, no murmur, click, rub or gallop Extremities: No LEE Pulses: 2+ and symmetric Skin: Warm and dry Neurologic: Grossly normal  Lab Results:   Recent Labs  08/12/14 0150 08/13/14 0418 08/14/14 0338  WBC 5.3 5.2 5.0  HGB 12.3 12.9 11.6*  HCT 36.5 38.3 33.2*  PLT 233 248 213   BMET  Recent Labs  08/12/14 0150 08/13/14 0418 08/14/14 0338  NA 139 140 140  K 3.5 4.0 3.6  CL 107 109 110  CO2 GLUCOSE 102* 102* 100*  BUN CREATININE 0.76 0.80 0.79  CALCIUM 9.5 9.8 9.3   PT/INR  Recent Labs  08/11/14 0812 08/13/14 0418  LABPROT 13.1 13.4  INR 0.98 1.01  Assessment/Plan  Active Problems:   NSTEMI (non-ST elevated myocardial infarction)   Elevated blood pressure reading   Elevated troponin   Leukopenia   Non-STEMI (non-ST elevated myocardial infarction)   Tobacco abuse  SP left heart cath revealing 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 Mild to moderate non-obstructive disease in the LAD and Circumflex.  She underwent successful PTCA/DES x 1 mid RCA.  Preserved LV systolic function.  ASA, Brilinta, ACE-I added.   BP much better.  Ambulate with CR and DC home today.     LOS: 2 days    HAGER, BRYAN PA-C 08/14/2014 7:35 AM  Agree, reinforced critical need for compliance with dual antiplatelet therapy. DC home today from cardiology point of view.  Thurmon FairMihai Ranbir Chew, MD, [facility] [phone] office [phone] pager

## 2014-08-16 ENCOUNTER — Encounter: Payer: Self-pay | Admitting: Family Medicine

## 2014-08-16 ENCOUNTER — Ambulatory Visit: Payer: Self-pay | Attending: Family Medicine | Admitting: Family Medicine

## 2014-08-16 VITALS — BP 173/96 | HR 64 | Temp 97.4°F | Resp 16 | Ht 60.0 in | Wt 146.0 lb

## 2014-08-16 DIAGNOSIS — I10 Essential (primary) hypertension: Secondary | ICD-10-CM

## 2014-08-16 DIAGNOSIS — Z720 Tobacco use: Secondary | ICD-10-CM

## 2014-08-16 DIAGNOSIS — I214 Non-ST elevation (NSTEMI) myocardial infarction: Secondary | ICD-10-CM

## 2014-08-16 MED ORDER — LISINOPRIL 20 MG PO TABS: 20.0000 mg | ORAL_TABLET | Freq: Every day | ORAL | Status: DC

## 2014-08-16 NOTE — Patient Instructions (Signed)
Smoking Cessation °Quitting smoking is important to your health and has many advantages. However, it is not always easy to quit since nicotine is a very addictive drug. Oftentimes, people try 3 times or more before being able to quit. This document explains the best ways for you to prepare to quit smoking. Quitting takes hard work and a lot of effort, but you can do it. °ADVANTAGES OF QUITTING SMOKING °· You will live longer, feel better, and live better. °· Your body will feel the impact of quitting smoking almost immediately. °¨ Within 20 minutes, blood pressure decreases. Your pulse returns to its normal level. °¨ After 8 hours, carbon monoxide levels in the blood return to normal. Your oxygen level increases. °¨ After 24 hours, the chance of having a heart attack starts to decrease. Your breath, hair, and body stop smelling like smoke. °¨ After 48 hours, damaged nerve endings begin to recover. Your sense of taste and smell improve. °¨ After 72 hours, the body is virtually free of nicotine. Your bronchial tubes relax and breathing becomes easier. °¨ After 2 to 12 weeks, lungs can hold more air. Exercise becomes easier and circulation improves. °· The risk of having a heart attack, stroke, cancer, or lung disease is greatly reduced. °¨ After 1 year, the risk of coronary heart disease is cut in half. °¨ After 5 years, the risk of stroke falls to the same as a nonsmoker. °¨ After 10 years, the risk of lung cancer is cut in half and the risk of other cancers decreases significantly. °¨ After 15 years, the risk of coronary heart disease drops, usually to the level of a nonsmoker. °· If you are pregnant, quitting smoking will improve your chances of having a healthy baby. °· The people you live with, especially any children, will be healthier. °· You will have extra money to spend on things other than cigarettes. °QUESTIONS TO THINK ABOUT BEFORE ATTEMPTING TO QUIT °You may want to talk about your answers with your  health care provider. °· Why do you want to quit? °· If you tried to quit in the past, what helped and what did not? °· What will be the most difficult situations for you after you quit? How will you plan to handle them? °· Who can help you through the tough times? Your family? Friends? A health care provider? °· What pleasures do you get from smoking? What ways can you still get pleasure if you quit? °Here are some questions to ask your health care provider: °· How can you help me to be successful at quitting? °· What medicine do you think would be best for me and how should I take it? °· What should I do if I need more help? °· What is smoking withdrawal like? How can I get information on withdrawal? °GET READY °· Set a quit date. °· Change your environment by getting rid of all cigarettes, ashtrays, matches, and lighters in your home, car, or work. Do not let people smoke in your home. °· Review your past attempts to quit. Think about what worked and what did not. °GET SUPPORT AND ENCOURAGEMENT °You have a better chance of being successful if you have help. You can get support in many ways. °· Tell your family, friends, and coworkers that you are going to quit and need their support. Ask them not to smoke around you. °· Get individual, group, or telephone counseling and support. Programs are available at local hospitals and health centers. Call   your local health department for information about programs in your area. °· Spiritual beliefs and practices may help some smokers quit. °· Download a "quit meter" on your computer to keep track of quit statistics, such as how long you have gone without smoking, cigarettes not smoked, and money saved. °· Get a self-help book about quitting smoking and staying off tobacco. °LEARN NEW SKILLS AND BEHAVIORS °· Distract yourself from urges to smoke. Talk to someone, go for a walk, or occupy your time with a task. °· Change your normal routine. Take a different route to work.  Drink tea instead of coffee. Eat breakfast in a different place. °· Reduce your stress. Take a hot bath, exercise, or read a book. °· Plan something enjoyable to do every day. Reward yourself for not smoking. °· Explore interactive web-based programs that specialize in helping you quit. °GET MEDICINE AND USE IT CORRECTLY °Medicines can help you stop smoking and decrease the urge to smoke. Combining medicine with the above behavioral methods and support can greatly increase your chances of successfully quitting smoking. °· Nicotine replacement therapy helps deliver nicotine to your body without the negative effects and risks of smoking. Nicotine replacement therapy includes nicotine gum, lozenges, inhalers, nasal sprays, and skin patches. Some may be available over-the-counter and others require a prescription. °· Antidepressant medicine helps people abstain from smoking, but how this works is unknown. This medicine is available by prescription. °· Nicotinic receptor partial agonist medicine simulates the effect of nicotine in your brain. This medicine is available by prescription. °Ask your health care provider for advice about which medicines to use and how to use them based on your health history. Your health care provider will tell you what side effects to look out for if you choose to be on a medicine or therapy. Carefully read the information on the package. Do not use any other product containing nicotine while using a nicotine replacement product.  °RELAPSE OR DIFFICULT SITUATIONS °Most relapses occur within the first 3 months after quitting. Do not be discouraged if you start smoking again. Remember, most people try several times before finally quitting. You may have symptoms of withdrawal because your body is used to nicotine. You may crave cigarettes, be irritable, feel very hungry, cough often, get headaches, or have difficulty concentrating. The withdrawal symptoms are only temporary. They are strongest  when you first quit, but they will go away within 10-14 days. °To reduce the chances of relapse, try to: °· Avoid drinking alcohol. Drinking lowers your chances of successfully quitting. °· Reduce the amount of caffeine you consume. Once you quit smoking, the amount of caffeine in your body increases and can give you symptoms, such as a rapid heartbeat, sweating, and anxiety. °· Avoid smokers because they can make you want to smoke. °· Do not let weight gain distract you. Many smokers will gain weight when they quit, usually less than 10 pounds. Eat a healthy diet and stay active. You can always lose the weight gained after you quit. °· Find ways to improve your mood other than smoking. °FOR MORE INFORMATION  °www.smokefree.gov  °Document Released: 05/05/2001 Document Revised: 09/25/2013 Document Reviewed: 08/20/2011 °ExitCare® Patient Information ©2015 [employer]. This information is not intended to replace advice given to you by your health care provider. Make sure you discuss any questions you have with your health care provider. °Hypertension °Hypertension, commonly called high blood pressure, is when the force of blood pumping through your arteries is too strong. Your arteries   are the blood vessels that carry blood from your heart throughout your body. A blood pressure reading consists of a higher number over a lower number, such as 110/72. The higher number (systolic) is the pressure inside your arteries when your heart pumps. The lower number (diastolic) is the pressure inside your arteries when your heart relaxes. Ideally you want your blood pressure below 120/80. °Hypertension forces your heart to work harder to pump blood. Your arteries may become narrow or stiff. Having hypertension puts you at risk for heart disease, stroke, and other problems.  °RISK FACTORS °Some risk factors for high blood pressure are controllable. Others are not.  °Risk factors you cannot control include:  °· Race. You may be at  higher risk if you are African American. °· Age. Risk increases with age. °· Gender. Men are at higher risk than women before age 45 years. After age 65, women are at higher risk than men. °Risk factors you can control include: °· Not getting enough exercise or physical activity. °· Being overweight. °· Getting too much fat, sugar, calories, or salt in your diet. °· Drinking too much alcohol. °SIGNS AND SYMPTOMS °Hypertension does not usually cause signs or symptoms. Extremely high blood pressure (hypertensive crisis) may cause headache, anxiety, shortness of breath, and nosebleed. °DIAGNOSIS  °To check if you have hypertension, your health care provider will measure your blood pressure while you are seated, with your arm held at the level of your heart. It should be measured at least twice using the same arm. Certain conditions can cause a difference in blood pressure between your right and left arms. A blood pressure reading that is higher than normal on one occasion does not mean that you need treatment. If one blood pressure reading is high, ask your health care provider about having it checked again. °TREATMENT  °Treating high blood pressure includes making lifestyle changes and possibly taking medicine. Living a healthy lifestyle can help lower high blood pressure. You may need to change some of your habits. °Lifestyle changes may include: °· Following the DASH diet. This diet is high in fruits, vegetables, and whole grains. It is low in salt, red meat, and added sugars. °· Getting at least 2½ hours of brisk physical activity every week. °· Losing weight if necessary. °· Not smoking. °· Limiting alcoholic beverages. °· Learning ways to reduce stress. ° If lifestyle changes are not enough to get your blood pressure under control, your health care provider may prescribe medicine. You may need to take more than one. Work closely with your health care provider to understand the risks and benefits. °HOME CARE  INSTRUCTIONS °· Have your blood pressure rechecked as directed by your health care provider.   °· Take medicines only as directed by your health care provider. Follow the directions carefully. Blood pressure medicines must be taken as prescribed. The medicine does not work as well when you skip doses. Skipping doses also puts you at risk for problems.   °· Do not smoke.   °· Monitor your blood pressure at home as directed by your health care provider.  °SEEK MEDICAL CARE IF:  °· You think you are having a reaction to medicines taken. °· You have recurrent headaches or feel dizzy. °· You have swelling in your ankles. °· You have trouble with your vision. °SEEK IMMEDIATE MEDICAL CARE IF: °· You develop a severe headache or confusion. °· You have unusual weakness, numbness, or feel faint. °· You have severe chest or abdominal pain. °· You vomit repeatedly. °·   You have trouble breathing. °MAKE SURE YOU:  °· Understand these instructions. °· Will watch your condition. °· Will get help right away if you are not doing well or get worse. °Document Released: 05/11/2005 Document Revised: 09/25/2013 Document Reviewed: 03/03/2013 °ExitCare® Patient Information ©2015 [employer]. This information is not intended to replace advice given to you by your health care provider. Make sure you discuss any questions you have with your health care provider. ° °

## 2014-08-16 NOTE — Progress Notes (Signed)
Pt was in the ED w/ chest pain on Monday. Pt suffered a minor heart attack. Pt has a stent put in and was told to follow up with a doctor at this clinic. Pt states that she feels better and that she is improving.

## 2014-08-16 NOTE — Progress Notes (Signed)
   Subjective:    Patient ID: Kim Brady, female    DOB: 04-04-1956, 59 y.o.   MRN: [id number]  HPI  Kim Brady is a new patient who is a hospital follow-up after being admitted for non-ST elevation myocardial infarction with insertion of a drug eluding stent in the mid RCA. She had presented on March 21 with chest pains and had troponins done which where elevated but EKG was normal. Cardiac Catheterization revealed severe single vessel disease to the mid RCA. She remains on Brilinta and ASA. Has an appointment on 4//8/16 with [facility] Complains of some dyspnea after cath, 2D echo done revealed EF 60-65%  HTN Reports doing well on antihypertensives and reports no side effects with medications With regards to low sodium diet and exercise the patient reports compliance.  Continues to smoke though.     Review of Systems  Review of Systems - General ROS: negative Psychological ROS: negative ENT ROS: negative Hematological and Lymphatic ROS: negative Endocrine ROS: negative Respiratory ROS: positive for - shortness of breath Cardiovascular ROS: positive for - dyspnea on exertion Gastrointestinal ROS: no abdominal pain, change in bowel habits, or black or bloody stools Genito-Urinary ROS: no dysuria, trouble voiding, or hematuria Musculoskeletal ROS: negative Neurological ROS: negative Dermatological ROS: negative       Objective:   Physical Exam  Constitutional: She is oriented to person, place, and time. She appears well-developed and well-nourished. No distress.  HENT:  Head: Normocephalic.  Eyes: Pupils are equal, round, and reactive to light.  Neck: Normal range of motion. No JVD present.  Cardiovascular: Normal rate, regular rhythm and normal heart sounds.  Exam reveals no gallop.   No murmur heard. Pulmonary/Chest: Effort normal and breath sounds normal. No respiratory distress. She has no wheezes. She has no rales. She exhibits no tenderness.    Abdominal: Soft. Bowel sounds are normal. She exhibits no distension. There is no tenderness.  Musculoskeletal: Normal range of motion.  Neurological: She is alert and oriented to person, place, and time.  Skin: Skin is warm and dry. She is not diaphoretic.  Psychiatric: She has a normal mood and affect.          Assessment & Plan:   New patient with NSTEMI status post placement on DES to the RCA.  1. NSTEMI - Advised to continue Dual antiplatelet therapy and keep appointment with Cardiology. Shortness of breath could be due to deconditioning as 2d echo is normal.  2.HTN Uncontrolled Hypertension Increase Lisinopril from 10 to 20mg . Patient to return for a reassessment of blood pressure at the next office visit. Advised on low sodium, DASH diet CMP in 1 mon due to hyperkalemia which is a side effect of Lisinopril.  3. Tobacco abuse Smoking cessation support: smoking cessation hotline: 1-800-QUIT-NOW.  Smoking cessation classes are available through [facility]. Call [phone] or visit our website at [email or web address].  Spent 3 counseling on smoking cessations and patient is not ready to quit.

## 2014-08-31 ENCOUNTER — Encounter: Payer: Self-pay | Admitting: Physician Assistant

## 2014-08-31 ENCOUNTER — Ambulatory Visit (INDEPENDENT_AMBULATORY_CARE_PROVIDER_SITE_OTHER): Payer: Self-pay | Admitting: Physician Assistant

## 2014-08-31 VITALS — BP 170/96 | HR 73 | Ht 60.0 in | Wt 145.0 lb

## 2014-08-31 DIAGNOSIS — I10 Essential (primary) hypertension: Secondary | ICD-10-CM

## 2014-08-31 DIAGNOSIS — I214 Non-ST elevation (NSTEMI) myocardial infarction: Secondary | ICD-10-CM

## 2014-08-31 MED ORDER — NITROGLYCERIN 0.4 MG SL SUBL: 0.4000 mg | SUBLINGUAL_TABLET | SUBLINGUAL | Status: DC | PRN

## 2014-08-31 MED ORDER — METOPROLOL TARTRATE 25 MG PO TABS: 25.0000 mg | ORAL_TABLET | Freq: Two times a day (BID) | ORAL | Status: DC

## 2014-08-31 MED ORDER — LISINOPRIL 20 MG PO TABS: 20.0000 mg | ORAL_TABLET | Freq: Every day | ORAL | Status: DC

## 2014-08-31 MED ORDER — CLOPIDOGREL BISULFATE 75 MG PO TABS: 75.0000 mg | ORAL_TABLET | Freq: Every day | ORAL | Status: DC

## 2014-08-31 NOTE — Progress Notes (Signed)
Cardiology Office Note   Date:  08/31/2014   ID:  Kim KettleVanessa L Stave, DOB 1955-10-17, MRN [id number]  PCP:  No PCP Per Patient  Cardiologist:  Dr. Mardee PostinKatz  [facility] Shein, [facility]   Hospital follow-up, epistaxis, shortness of breath  History of Present Illness: Kim Brady is a 59 y.o. female with a history of inferior non-STEMI March 2016. She had a drug-eluting stent to the RCA on 03/21, single vessel disease. She was discharged on 3/22. On 03/24, she saw her primary care physician and her lisinopril was increased from 10 mg daily to 20 mg daily. She has not had this filled. She is here today for cardiology follow-up.  Since discharge from the hospital, Ms. Aundria RudRogers has not had any chest pain. She was describing some shortness of breath to her primary care physician on 03/24, but she says this is improved, although it is still present. She describes recent onset of epistaxis. She states that when she blows her nose she will get blood and she also has blood drip at times. This started recently, she has no history of this before her non-STEMI.   She is compliant with all of her medications, she just did not realize she was supposed to get a new prescription filled for lisinopril. She has no headaches or dizziness.  She reports that she has not had any cigarettes since she left the hospital.   Past Medical History  Diagnosis Date  . Chronic kidney disease   . NSTEMI (non-ST elevated myocardial infarction) 08/10/2014    Inferior, with drug-eluting stent to the RCA  . Essential hypertension     Past Surgical History  Procedure Laterality Date  . Left heart catheterization with coronary angiogram N/A 08/13/2014    Procedure: LEFT HEART CATHETERIZATION WITH CORONARY ANGIOGRAM;  Surgeon: Kathleene Hazelhristopher D McAlhany, MD;  [facility];  Service: Cardiovascular;  Laterality: N/A;    Current Outpatient Prescriptions  Medication Sig Dispense Refill  . acetaminophen (TYLENOL) 500 MG  tablet Take 1,000 mg by mouth every 6 (six) hours as needed for mild pain.    Marland Kitchen. aspirin EC 81 MG EC tablet Take 1 tablet (81 mg total) by mouth daily. 30 tablet 0  . bismuth subsalicylate (PEPTO BISMOL) 262 MG/15ML suspension Take 30 mLs by mouth every 6 (six) hours as needed for indigestion.    Marland Kitchen. lisinopril (PRINIVIL,ZESTRIL) 20 MG tablet Take 1 tablet (20 mg total) by mouth daily. 30 tablet 2  . pravastatin (PRAVACHOL) 40 MG tablet Take 1 tablet (40 mg total) by mouth daily. 30 tablet 11  . clopidogrel (PLAVIX) 75 MG tablet Take 1 tablet (75 mg total) by mouth daily. 30 tablet 6  . metoprolol tartrate (LOPRESSOR) 25 MG tablet Take 1 tablet (25 mg total) by mouth 2 (two) times daily. 60 tablet 6  . nitroGLYCERIN (NITROSTAT) 0.4 MG SL tablet Place 1 tablet (0.4 mg total) under the tongue every 5 (five) minutes as needed for chest pain. 25 tablet 3   No current facility-administered medications for this visit.    Allergies:   Review of patient's allergies indicates no known allergies.    Social History:  The patient  reports that she has been smoking Cigarettes.  She has been smoking about 1.00 pack per day. She does not have any smokeless tobacco history on file. She reports that she does not drink alcohol or use illicit drugs.   Family History:  The patient's family history includes Heart attack in her father  and mother.    ROS:  Please see the history of present illness. All other systems are reviewed and negative.    PHYSICAL EXAM: VS:  BP 170/96 mmHg  Pulse 73  Ht 5' (1.524 m)  Wt 145 lb (65.772 kg)  BMI 28.32 kg/m2 , BMI Body mass index is 28.32 kg/(m^2). GEN: Well nourished, well developed, in no acute distress HEENT: normal Neck: no JVD, carotid bruits, or masses Cardiac: RRR; no murmurs, rubs, or gallops,no edema  Respiratory:  clear to auscultation bilaterally, normal work of breathing GI: soft, nontender, nondistended, + BS MS: no deformity or atrophy Skin: warm and dry,  no rash Neuro:  Strength and sensation are intact Psych: euthymic mood, full affect  EKG:  EKG is ordered today. The ekg ordered today demonstrates sinus rhythm, rate 73, inferior T-wave abnormalities are improved from previous ECG. Lateral T-wave flattening is also improved from previous ECG   Recent Labs: 08/14/2014: BUN 12; Creatinine 0.79; Hemoglobin 11.6*; Platelets 213; Potassium 3.6; Sodium 140    Lipid Panel No results found for: CHOL, TRIG, HDL, CHOLHDL, VLDL, LDLCALC, LDLDIRECT   Wt Readings from Last 3 Encounters:  08/31/14 145 lb (65.772 kg)  08/16/14 146 lb (66.225 kg)  08/14/14 132 lb 4.4 oz (60 kg)     Other studies Reviewed: Additional studies/ records that were reviewed today include: Cardiac catheterization, discharge summary. Review of the above records demonstrates: Single vessel CAD   ASSESSMENT AND PLAN:  1.  Non-STEMI, status post DES to the RCA: The patient is having nosebleeds and shortness of breath that is most likely secondary to the Sarah Ann to. Brilinta will be discontinued and the patient was instructed in this verbally. She will be started on Plavix and is advised to start this tomorrow. She will get a nurse visit in one-2 weeks to make sure her nosebleed has resolved and to make sure her shortness of breath has improved. She will get a P2Y12 drawn at that time. A prescription for sublingual nitroglycerin was sent in to her pharmacy.  2. Hypertension: At her last visit with primary care, her lisinopril was increased from 10 up to 20 mg daily.   The patient was not aware of this and never picked up the prescription. This prescription was renewed so that they would go ahead and fill it.   Her metoprolol was increased from 25 mg one half tab twice a day to 25 mg twice a day. She should follow-up with primary care for further blood pressure medication adjustments.  3. Unknown lipid status: A lipid profile and a hepatic function  profile were not checked  during her recent hospitalization. She is on Pravachol and is compliant with this. We will check a lipid profile and LFTs at her RN visit in a couple of weeks.  Current medicines are reviewed at length with the patient today.  The patient does not have concerns regarding medicines.  The following changes have been made:  Increase metoprolol to 25 mg twice a day, discontinue Brilinta, start Plavix 75 mg daily tomorrow, add sublingual nitroglycerin to medication profile, make sure to pick up Lisinopril 20 mg daily  Labs/ tests ordered today include:   Orders Placed This Encounter  Procedures  . Lipid Profile  . Hepatic function panel  . EKG 12-Lead    Disposition:   FU with Dr. Myrtis Ser in 2 months   Signed, Theodore Demark, PA-C  08/31/2014 2:31 PM    [facility] [address]  [address], [place], [place]  [address] Phone: [phone]; Fax: [phone]

## 2014-08-31 NOTE — Patient Instructions (Signed)
STOP TAKING BIRLINTA   START TAKING PLAVIX 75 MG ONCE A DAY   START TAKING METOPROLOL 25 MG TWICE A DAY    FOLLOW UP WITH DR KATZ IN 2 TO 3 MONTHS OR NEXT AVAILABLE APPT   FASTING LIPIDS ON SAME DAYS AS NURSE VISIT (LINK ORDERS )  FOLLOW UP WITH A NURSE VISIT IN 2 TO 3 WEEKS  FOR LABS P2Y12 AND NOSE BLEEDS EARLY MORNING IF POSSIBLE

## 2014-09-05 ENCOUNTER — Other Ambulatory Visit: Payer: Self-pay

## 2014-09-10 ENCOUNTER — Other Ambulatory Visit: Payer: Self-pay | Admitting: *Deleted

## 2014-09-10 MED ORDER — PRAVASTATIN SODIUM 40 MG PO TABS: 40.0000 mg | ORAL_TABLET | Freq: Every day | ORAL | Status: DC

## 2014-09-18 ENCOUNTER — Ambulatory Visit (INDEPENDENT_AMBULATORY_CARE_PROVIDER_SITE_OTHER): Payer: Self-pay | Admitting: *Deleted

## 2014-09-18 ENCOUNTER — Other Ambulatory Visit (INDEPENDENT_AMBULATORY_CARE_PROVIDER_SITE_OTHER): Payer: Self-pay | Admitting: *Deleted

## 2014-09-18 VITALS — BP 207/96 | HR 60 | Ht 60.0 in | Wt 147.5 lb

## 2014-09-18 DIAGNOSIS — I214 Non-ST elevation (NSTEMI) myocardial infarction: Secondary | ICD-10-CM

## 2014-09-18 DIAGNOSIS — I10 Essential (primary) hypertension: Secondary | ICD-10-CM

## 2014-09-18 LAB — LIPID PANEL
Cholesterol: 178 mg/dL (ref 0–200)
HDL: 47.2 mg/dL (ref >39.00)
LDL CALC: 112 mg/dL — AB (ref 0–99)
NonHDL: 130.8
Total CHOL/HDL Ratio: 4
Triglycerides: 93 mg/dL (ref 0.0–149.0)
VLDL: 18.6 mg/dL (ref 0.0–40.0)

## 2014-09-18 LAB — HEPATIC FUNCTION PANEL
ALK PHOS: 91 U/L (ref 39–117)
ALT: 24 U/L (ref 0–35)
AST: 18 U/L (ref 0–37)
Albumin: 4.6 g/dL (ref 3.5–5.2)
BILIRUBIN DIRECT: 0.1 mg/dL (ref 0.0–0.3)
TOTAL PROTEIN: 7.8 g/dL (ref 6.0–8.3)
Total Bilirubin: 0.7 mg/dL (ref 0.2–1.2)

## 2014-09-18 NOTE — Progress Notes (Signed)
Reason for visit: F/u after changing from Brilinta to Plavix.  Nosebleeds have stopped.  SOB is much improved.  Name of MD requesting visit: Rhonda Barrett, PA-C  1.) H&P: BP improved on repeat 186/87.  Pt has no other complaints  2.) ROS related to problem: No nosebleeds since changing medication - SOB has improved.  3.) Assessment and plan per MD:  [facility]

## 2014-09-18 NOTE — Patient Instructions (Signed)
Medication Instructions:  Your physician recommends that you continue on your current medications as directed. Please refer to the Current Medication list given to you today.  Labwork: Have blood work as ordered by Bjorn Loserhonda at last office visit.  Testing/Procedures: Lipid and Liver  Follow-Up: As instructed.

## 2014-09-21 NOTE — Addendum Note (Signed)
Addended by: Sharin GraveFLEMING, Sherley Leser J on: 09/21/2014 09:51 AM   Modules accepted: Level of Service

## 2014-10-10 ENCOUNTER — Telehealth: Payer: Self-pay | Admitting: *Deleted

## 2014-10-10 NOTE — Telephone Encounter (Signed)
Cardiac Rehab Rn called on behalf of the patient wanting patient to get an establish care and financial counseling appointment after seeing Dr. Venetia NightAmao for a hospital follow up appointment.  Call[facility] and [facility] to set up both appointments and then [facility] to let her know appointments had been made.  She said patient had an appointment at cardiac rehab on Thursday and that she would stress the importance of her coming to her appointments here.

## 2014-10-11 ENCOUNTER — Ambulatory Visit (HOSPITAL_COMMUNITY): Payer: Self-pay

## 2014-10-15 ENCOUNTER — Ambulatory Visit (HOSPITAL_COMMUNITY): Payer: Self-pay

## 2014-10-17 ENCOUNTER — Ambulatory Visit (HOSPITAL_COMMUNITY): Payer: Self-pay

## 2014-10-19 ENCOUNTER — Ambulatory Visit (HOSPITAL_COMMUNITY): Payer: Self-pay

## 2014-10-24 ENCOUNTER — Ambulatory Visit (HOSPITAL_COMMUNITY): Payer: Self-pay

## 2014-10-26 ENCOUNTER — Ambulatory Visit: Payer: Self-pay | Attending: Family Medicine | Admitting: Family Medicine

## 2014-10-26 ENCOUNTER — Ambulatory Visit (HOSPITAL_COMMUNITY): Payer: Self-pay

## 2014-10-26 ENCOUNTER — Encounter: Payer: Self-pay | Admitting: Family Medicine

## 2014-10-26 VITALS — BP 126/80 | HR 73 | Temp 98.4°F | Resp 16 | Ht 60.0 in | Wt 145.0 lb

## 2014-10-26 DIAGNOSIS — I214 Non-ST elevation (NSTEMI) myocardial infarction: Secondary | ICD-10-CM

## 2014-10-26 DIAGNOSIS — Z114 Encounter for screening for human immunodeficiency virus [HIV]: Secondary | ICD-10-CM

## 2014-10-26 DIAGNOSIS — Z0000 Encounter for general adult medical examination without abnormal findings: Secondary | ICD-10-CM

## 2014-10-26 DIAGNOSIS — I10 Essential (primary) hypertension: Secondary | ICD-10-CM

## 2014-10-26 MED ORDER — LISINOPRIL 20 MG PO TABS: 20.0000 mg | ORAL_TABLET | Freq: Every day | ORAL | Status: DC

## 2014-10-26 MED ORDER — NITROGLYCERIN 0.4 MG SL SUBL: 0.4000 mg | SUBLINGUAL_TABLET | SUBLINGUAL | Status: DC | PRN

## 2014-10-26 MED ORDER — ASPIRIN 81 MG PO TBEC: 81.0000 mg | DELAYED_RELEASE_TABLET | Freq: Every day | ORAL | Status: AC

## 2014-10-26 MED ORDER — PRAVASTATIN SODIUM 40 MG PO TABS: 40.0000 mg | ORAL_TABLET | Freq: Every day | ORAL | Status: DC

## 2014-10-26 MED ORDER — METOPROLOL TARTRATE 25 MG PO TABS: 25.0000 mg | ORAL_TABLET | Freq: Two times a day (BID) | ORAL | Status: DC

## 2014-10-26 NOTE — Assessment & Plan Note (Signed)
Screening HIV ordered  

## 2014-10-26 NOTE — Assessment & Plan Note (Signed)
A: Recent heart attack, no CP. BP well controlled. Compliant with secondary prevention including smoking cessation  P: Refilled plavix, aspirin and statin

## 2014-10-26 NOTE — Assessment & Plan Note (Signed)
A: HTN, well controlled Med: compliant P: Continue current regimen 

## 2014-10-26 NOTE — Progress Notes (Signed)
Establish Care with PCP Hx HTN No Hx Tobacco- quit on August 06 2014

## 2014-10-26 NOTE — Progress Notes (Signed)
   Subjective:    Patient ID: Cannon KettleVanessa L Stork, female    DOB: [birth year], 59 y.o.   MRN: [id number] CC: f.u HTN   HPI  1. CHRONIC HYPERTENSION  Disease Monitoring  Blood pressure range: does not check   Chest pain: no   Dyspnea: yes, a little at times    Claudication: no   Medication compliance: yes  Medication Side Effects  Lightheadedness: no   Urinary frequency: yes,    Edema: no   Preventitive Healthcare:  Exercise: no   Diet Pattern: regular meals,  Salt Restriction: yes   2. Recent NSTEMI: no CP. Intermittent SOB. Has quit smoking. No leg swelling.   Soc Hx: former smoker, has quit  Review of Systems  Constitutional: Negative for fever and chills.  Respiratory: Positive for shortness of breath. Negative for cough and wheezing.   Cardiovascular: Negative for chest pain, palpitations and leg swelling.       Objective:   Physical Exam BP 126/80 mmHg  Pulse 73  Temp(Src) 98.4 F (36.9 C) (Oral)  Resp 16  Ht 5' (1.524 m)  Wt 145 lb (65.772 kg)  BMI 28.32 kg/m2  SpO2 95%  Wt Readings from Last 3 Encounters:  10/26/14 145 lb (65.772 kg)  09/18/14 147 lb 8 oz (66.906 kg)  08/31/14 145 lb (65.772 kg)  General appearance: alert, cooperative and no distress Lungs: clear to auscultation bilaterally Heart: regular rate and rhythm, S1, S2 normal, no murmur, click, rub or gallop Extremities: extremities normal, atraumatic, no cyanosis or edema     Assessment & Plan:

## 2014-10-26 NOTE — Assessment & Plan Note (Signed)
GI referral for screening colonoscopy Mammogram ordered

## 2014-10-26 NOTE — Patient Instructions (Signed)
Ms. Kim Brady,  1. BP well controlled Continue current regimen  2. Recent heart attack; Great job quitting smoking Refilled plavix, aspirin and statin  F/u in 3 months  Dr. Armen PickupFunches

## 2014-10-27 LAB — HIV ANTIBODY (ROUTINE TESTING W REFLEX): HIV: NONREACTIVE

## 2014-10-29 ENCOUNTER — Ambulatory Visit (HOSPITAL_COMMUNITY): Payer: Self-pay

## 2014-10-31 ENCOUNTER — Ambulatory Visit (HOSPITAL_COMMUNITY): Payer: Self-pay

## 2014-11-01 ENCOUNTER — Ambulatory Visit (HOSPITAL_COMMUNITY): Payer: Self-pay

## 2014-11-02 ENCOUNTER — Ambulatory Visit (HOSPITAL_COMMUNITY): Payer: Self-pay

## 2014-11-05 ENCOUNTER — Ambulatory Visit (HOSPITAL_COMMUNITY): Payer: Self-pay

## 2014-11-06 ENCOUNTER — Telehealth: Payer: Self-pay | Admitting: *Deleted

## 2014-11-06 NOTE — Telephone Encounter (Signed)
-----   Message from Dessa Phi, MD sent at 10/28/2014  2:17 PM EDT ----- Screening HIV negative

## 2014-11-06 NOTE — Telephone Encounter (Signed)
Pt aware of results 

## 2014-11-07 ENCOUNTER — Ambulatory Visit (HOSPITAL_COMMUNITY): Payer: Self-pay

## 2014-11-08 ENCOUNTER — Ambulatory Visit (HOSPITAL_COMMUNITY): Payer: Self-pay

## 2014-11-09 ENCOUNTER — Ambulatory Visit (HOSPITAL_COMMUNITY): Payer: Self-pay

## 2014-11-12 ENCOUNTER — Ambulatory Visit (HOSPITAL_COMMUNITY): Payer: Self-pay

## 2014-11-13 ENCOUNTER — Ambulatory Visit: Payer: Self-pay | Attending: Family Medicine

## 2014-11-14 ENCOUNTER — Ambulatory Visit (HOSPITAL_COMMUNITY): Payer: Self-pay

## 2014-11-16 ENCOUNTER — Ambulatory Visit (HOSPITAL_COMMUNITY): Payer: Self-pay

## 2014-11-19 ENCOUNTER — Ambulatory Visit (HOSPITAL_COMMUNITY): Payer: Self-pay

## 2014-11-20 ENCOUNTER — Ambulatory Visit: Payer: Self-pay | Attending: Family Medicine

## 2014-11-21 ENCOUNTER — Ambulatory Visit (HOSPITAL_COMMUNITY): Payer: Self-pay

## 2014-11-23 ENCOUNTER — Ambulatory Visit (HOSPITAL_COMMUNITY): Payer: Self-pay

## 2014-11-26 ENCOUNTER — Ambulatory Visit (HOSPITAL_COMMUNITY): Payer: Self-pay

## 2014-11-28 ENCOUNTER — Ambulatory Visit (HOSPITAL_COMMUNITY): Payer: Self-pay

## 2014-11-30 ENCOUNTER — Ambulatory Visit (HOSPITAL_COMMUNITY): Payer: Self-pay

## 2014-12-03 ENCOUNTER — Ambulatory Visit (HOSPITAL_COMMUNITY): Payer: Self-pay

## 2014-12-05 ENCOUNTER — Ambulatory Visit (HOSPITAL_COMMUNITY): Payer: Self-pay

## 2014-12-05 ENCOUNTER — Inpatient Hospital Stay (HOSPITAL_COMMUNITY): Admission: RE | Admit: 2014-12-05 | Payer: Self-pay | Source: Ambulatory Visit

## 2014-12-07 ENCOUNTER — Ambulatory Visit (HOSPITAL_COMMUNITY): Payer: Self-pay

## 2014-12-10 ENCOUNTER — Ambulatory Visit (HOSPITAL_COMMUNITY): Payer: Self-pay

## 2014-12-12 ENCOUNTER — Ambulatory Visit (HOSPITAL_COMMUNITY): Payer: Self-pay

## 2014-12-14 ENCOUNTER — Ambulatory Visit (HOSPITAL_COMMUNITY): Payer: Self-pay

## 2014-12-17 ENCOUNTER — Ambulatory Visit (HOSPITAL_COMMUNITY): Payer: Self-pay

## 2014-12-19 ENCOUNTER — Ambulatory Visit (HOSPITAL_COMMUNITY): Payer: Self-pay

## 2014-12-21 ENCOUNTER — Ambulatory Visit (HOSPITAL_COMMUNITY): Payer: Self-pay

## 2014-12-24 ENCOUNTER — Ambulatory Visit (HOSPITAL_COMMUNITY): Payer: Self-pay

## 2014-12-26 ENCOUNTER — Ambulatory Visit (HOSPITAL_COMMUNITY): Payer: Self-pay

## 2014-12-28 ENCOUNTER — Ambulatory Visit (HOSPITAL_COMMUNITY): Payer: Self-pay

## 2014-12-31 ENCOUNTER — Ambulatory Visit (HOSPITAL_COMMUNITY): Payer: Self-pay

## 2015-01-02 ENCOUNTER — Ambulatory Visit (HOSPITAL_COMMUNITY): Payer: Self-pay

## 2015-01-04 ENCOUNTER — Ambulatory Visit (HOSPITAL_COMMUNITY): Payer: Self-pay

## 2015-01-07 ENCOUNTER — Ambulatory Visit (HOSPITAL_COMMUNITY): Payer: Self-pay

## 2015-01-09 ENCOUNTER — Ambulatory Visit (HOSPITAL_COMMUNITY): Payer: Self-pay

## 2015-01-11 ENCOUNTER — Ambulatory Visit (HOSPITAL_COMMUNITY): Payer: Self-pay

## 2015-01-14 ENCOUNTER — Ambulatory Visit (HOSPITAL_COMMUNITY): Payer: Self-pay

## 2015-01-16 ENCOUNTER — Ambulatory Visit (HOSPITAL_COMMUNITY): Payer: Self-pay

## 2015-01-18 ENCOUNTER — Ambulatory Visit (HOSPITAL_COMMUNITY): Payer: Self-pay

## 2015-01-21 ENCOUNTER — Ambulatory Visit (HOSPITAL_COMMUNITY): Payer: Self-pay

## 2015-01-23 ENCOUNTER — Ambulatory Visit (HOSPITAL_COMMUNITY): Payer: Self-pay

## 2015-01-25 ENCOUNTER — Ambulatory Visit (HOSPITAL_COMMUNITY): Payer: Self-pay

## 2015-01-28 ENCOUNTER — Ambulatory Visit (HOSPITAL_COMMUNITY): Payer: Self-pay

## 2015-01-30 ENCOUNTER — Ambulatory Visit (HOSPITAL_COMMUNITY): Payer: Self-pay

## 2015-02-01 ENCOUNTER — Ambulatory Visit (HOSPITAL_COMMUNITY): Payer: Self-pay

## 2015-02-04 ENCOUNTER — Ambulatory Visit (HOSPITAL_COMMUNITY): Payer: Self-pay

## 2015-02-11 ENCOUNTER — Encounter: Payer: Self-pay | Admitting: Cardiology

## 2015-02-11 DIAGNOSIS — R0602 Shortness of breath: Secondary | ICD-10-CM | POA: Insufficient documentation

## 2015-02-11 DIAGNOSIS — R040 Epistaxis: Secondary | ICD-10-CM | POA: Insufficient documentation

## 2015-02-11 DIAGNOSIS — E785 Hyperlipidemia, unspecified: Secondary | ICD-10-CM | POA: Insufficient documentation

## 2015-02-11 DIAGNOSIS — I2510 Atherosclerotic heart disease of native coronary artery without angina pectoris: Secondary | ICD-10-CM | POA: Insufficient documentation

## 2015-02-11 DIAGNOSIS — I2583 Coronary atherosclerosis due to lipid rich plaque: Secondary | ICD-10-CM

## 2015-02-11 DIAGNOSIS — R9430 Abnormal result of cardiovascular function study, unspecified: Secondary | ICD-10-CM | POA: Insufficient documentation

## 2015-02-13 ENCOUNTER — Encounter: Payer: Self-pay | Admitting: Cardiology

## 2015-02-13 ENCOUNTER — Ambulatory Visit (INDEPENDENT_AMBULATORY_CARE_PROVIDER_SITE_OTHER): Payer: Self-pay | Admitting: Cardiology

## 2015-02-13 VITALS — BP 130/60 | HR 64 | Ht 60.0 in | Wt 147.0 lb

## 2015-02-13 DIAGNOSIS — E785 Hyperlipidemia, unspecified: Secondary | ICD-10-CM

## 2015-02-13 DIAGNOSIS — I25118 Atherosclerotic heart disease of native coronary artery with other forms of angina pectoris: Secondary | ICD-10-CM

## 2015-02-13 NOTE — Patient Instructions (Signed)
Medication Instructions:  Same-no changes  Labwork: None  Testing/Procedures: None  Follow-Up: Your physician wants you to follow-up in: 6 months. You will receive a reminder letter in the mail two months in advance. If you don't receive a letter, please call our office to schedule the follow-up appointment.      

## 2015-02-13 NOTE — Progress Notes (Signed)
Cardiology Office Note   Date:  02/13/2015   ID:  Kim Brady, DOB 1955-10-14, MRN [id number]  PCP:  No PCP Per Patient  Cardiologist:  Willa Rough, MD   Chief Complaint  Patient presents with  . Appointment    Follow-up coronary artery disease      History of Present Illness: Kim Brady is a 59 y.o. female who presents today to follow-up coronary disease. She had a non-STEMI in March, 2016. She received a drug-eluting stent. She's doing well. Her med[facility] have been [facility] the [facility]. Cholesterol medication has been added. She is not having any significant chest pain or shortness of breath.    Past Medical History  Diagnosis Date  . Chronic kidney disease   . NSTEMI (non-ST elevated myocardial infarction) 08/10/2014    Inferior, with drug-eluting stent to the RCA  . Essential hypertension Dx March 2016  . Ejection fraction     Past Surgical History  Procedure Laterality Date  . Left heart catheterization with coronary angiogram N/A 08/13/2014    Procedure: LEFT HEART CATHETERIZATION WITH CORONARY ANGIOGRAM;  Surgeon: Kathleene Hazel, MD;  Location: [facility] CATH LAB;  Service: Cardiovascular;  Laterality: N/A;    Patient Active Problem List   Diagnosis Date Noted  . CAD (coronary artery disease) 02/11/2015  . Shortness of breath 02/11/2015  . Hyperlipidemia 02/11/2015  . Nosebleed 02/11/2015  . Ejection fraction   . Healthcare maintenance 10/26/2014  . Screening for HIV (human immunodeficiency virus) 10/26/2014  . Essential hypertension, benign 08/16/2014  . Non-STEMI (non-ST elevated myocardial infarction) 08/11/2014  . Tobacco abuse 08/11/2014      Current Outpatient Prescriptions  Medication Sig Dispense Refill  . acetaminophen (TYLENOL) 500 MG tablet Take 1,000 mg by mouth every 6 (six) hours as needed for mild pain.    Marland Kitchen aspirin 81 MG EC tablet Take 1 tablet (81 mg total) by mouth daily. 30 tablet 11  . bismuth subsalicylate (PEPTO  BISMOL) 262 MG/15ML suspension Take 30 mLs by mouth every 6 (six) hours as needed for indigestion.    . clopidogrel (PLAVIX) 75 MG tablet Take 1 tablet (75 mg total) by mouth daily. 30 tablet 6  . lisinopril (PRINIVIL,ZESTRIL) 20 MG tablet Take 1 tablet (20 mg total) by mouth daily. 30 tablet 11  . metoprolol tartrate (LOPRESSOR) 25 MG tablet Take 1 tablet (25 mg total) by mouth 2 (two) times daily. 60 tablet 11  . nitroGLYCERIN (NITROSTAT) 0.4 MG SL tablet Place 1 tablet (0.4 mg total) under the tongue every 5 (five) minutes as needed for chest pain. 25 tablet 2  . pravastatin (PRAVACHOL) 40 MG tablet Take 1 tablet (40 mg total) by mouth daily. 30 tablet 11   No current facility-administered medications for this visit.    Allergies:   Review of patient's allergies indicates no known allergies.    Social History:  The patient  reports that she quit smoking about 5 months ago. Her smoking use included Cigarettes. She smoked 1.00 pack per day. She does not have any smokeless tobacco history on file. She reports that she does not drink alcohol or use illicit drugs.   Family History:  The patient's family history includes Heart attack in her father and mother; Hypertension in her maternal uncle and mother; Lupus in her sister. There is no history of Stroke.    ROS:  Please see the history of present illness.    Patient denies fever, chills, headache, sweats, rash, change in  vision, change in hearing, chest pain, cough, nausea or vomiting, urinary symptoms. All other systems are reviewed and are negative.    PHYSICAL EXAM: VS:  BP 130/60 mmHg  Pulse 64  Ht 5' (1.524 m)  Wt 147 lb (66.679 kg)  BMI 28.71 kg/m2 , Patient is oriented to person time and place. Affect is normal. Head is atraumatic. Sclera and conjunctiva are normal. There is no jugular venous distention. Lungs are clear. Respiratory effort is nonlabored. Cardiac exam reveals an S1 and S2. Abdomen is soft. There is no peripheral  edema. There are no musculoskeletal deformities. There are no skin rashes.   EKG:   EKG is not done today.   Recent Labs: 08/14/2014: BUN 12; Creatinine, Ser 0.79; Hemoglobin 11.6*; Platelets 213; Potassium 3.6; Sodium 140 09/18/2014: ALT 24    Lipid Panel    Component Value Date/Time   CHOL 178 09/18/2014 0958   TRIG 93.0 09/18/2014 0958   HDL 47.20 09/18/2014 0958   CHOLHDL 4 09/18/2014 0958   VLDL 18.6 09/18/2014 0958   LDLCALC 112* 09/18/2014 0958      Wt Readings from Last 3 Encounters:  02/13/15 147 lb (66.679 kg)  10/26/14 145 lb (65.772 kg)  09/18/14 147 lb 8 oz (66.906 kg)      Current medicines are reviewed  The patient understands her medications.     ASSESSMENT AND PLAN:

## 2015-02-13 NOTE — Assessment & Plan Note (Signed)
The patient has been started on a statin since her last visit. She is on 40 mg of Pravachol. Recommendation would be to change her to a more potent statin such as atorvastatin. Her finances are limited. Hopefully this can be done through a generic. I will await to see if this can be allowed through the wellness clinic.

## 2015-02-13 NOTE — Assessment & Plan Note (Signed)
The patient's coronary disease is stable. She continues on aspirin and Plavix. No change in therapy.

## 2015-04-10 ENCOUNTER — Other Ambulatory Visit: Payer: Self-pay | Admitting: Physician Assistant

## 2015-07-01 ENCOUNTER — Other Ambulatory Visit: Payer: Self-pay | Admitting: Family Medicine

## 2015-07-01 MED FILL — LISINOPRIL 20 MG TABLET: 20 | 30 days supply | Qty: 30 | Fill #7

## 2015-07-01 MED FILL — ?METOPROLOL 25 MG TABLET: 25 | 30 days supply | Qty: 60 | Fill #7

## 2015-07-01 MED FILL — PRAVASTATIN NA 40 MG TAB: 40 | 30 days supply | Qty: 30 | Fill #7

## 2015-07-11 ENCOUNTER — Telehealth: Payer: Self-pay | Admitting: *Deleted

## 2015-07-11 MED FILL — CLOPIDOGREL 75 MG TABLET: 75 | 30 days supply | Qty: 30 | Fill #0

## 2015-07-11 NOTE — Telephone Encounter (Signed)
Agree. SKAINS, MARK, MD  

## 2015-07-11 NOTE — Telephone Encounter (Signed)
Will forward to Dr Skains for his knowledge.  °

## 2015-07-11 NOTE — Telephone Encounter (Signed)
-----   Message frStarla Link Milks, RN sent at 07/11/2015  9:28 AM EST ----- Mrs. Robart came to the [facility] today for her 1 year Follow-Up Visit for the [facility]. In reporting her medications, it was noted that she was not taking her ASA 81 mg daily or her Plavix 75 mg daily.  Dr. Myrtis Ser had stated in his last office visit that he would continue these medications. I sent her directly to the [facility] to get these medications.  She was instructed to restart these medications today.  She did not know how long she had been off these medications, but thinks it was 1 month.  I will call her later today to verify that she did restart the medications.  She has a visit scheduled to see Dr. Anne Fu on 08/12/2015.  Claire Shown, RN, Research Nurse [phone]

## 2015-08-01 MED FILL — LISINOPRIL 20 MG TABLET: 20 | 30 days supply | Qty: 30 | Fill #8

## 2015-08-01 MED FILL — CLOPIDOGREL 75 MG TABLET: 75 | 30 days supply | Qty: 30 | Fill #1

## 2015-08-01 MED FILL — PRAVASTATIN NA 40 MG TAB: 40 | 30 days supply | Qty: 30 | Fill #8

## 2015-08-01 MED FILL — ?METOPROLOL 25 MG TABLET: 25 | 30 days supply | Qty: 60 | Fill #8

## 2015-08-12 ENCOUNTER — Ambulatory Visit: Payer: Self-pay | Admitting: Cardiology

## 2015-08-14 ENCOUNTER — Ambulatory Visit (INDEPENDENT_AMBULATORY_CARE_PROVIDER_SITE_OTHER): Payer: Self-pay | Admitting: Cardiology

## 2015-08-14 ENCOUNTER — Encounter: Payer: Self-pay | Admitting: Cardiology

## 2015-08-14 VITALS — BP 124/74 | HR 64 | Ht 60.0 in | Wt 150.0 lb

## 2015-08-14 DIAGNOSIS — I2583 Coronary atherosclerosis due to lipid rich plaque: Principal | ICD-10-CM

## 2015-08-14 DIAGNOSIS — E785 Hyperlipidemia, unspecified: Secondary | ICD-10-CM

## 2015-08-14 DIAGNOSIS — I2510 Atherosclerotic heart disease of native coronary artery without angina pectoris: Secondary | ICD-10-CM

## 2015-08-14 DIAGNOSIS — Z720 Tobacco use: Secondary | ICD-10-CM

## 2015-08-14 DIAGNOSIS — I10 Essential (primary) hypertension: Secondary | ICD-10-CM

## 2015-08-14 NOTE — Progress Notes (Signed)
Cardiology Office Note    Date:  08/14/2015   ID:  Kim Brady, DOB 11/08/1955, MRN [id number]  PCP:  No PCP Per Patient  Cardiologist:   Donato Schultz, MD (Former Myrtis Ser)    History of Present Illness:  Kim Brady is a 60 y.o. female former patient of Dr. Myrtis Ser with prior non-ST elevation myocardial infarction in March 2016, DES  to mid RCA, doing well. Continuing both Plavix and aspirin. Mild dizziness, No CP, no SOB (maybe mild when laying down). She is noticing some mild spotting of blood when taking Plavix in her underwear.  At some point in the past year she had been off of aspirin and Plavix according to bio flow research study coordinator, Kennon Rounds.   Overall she is having no new symptoms. I encouraged her to obtain a primary physician. We will help with this.      Past Medical History  Diagnosis Date  . Chronic kidney disease   . NSTEMI (non-ST elevated myocardial infarction) (HCC) 08/10/2014    Inferior, with drug-eluting stent to the RCA  . Essential hypertension Dx March 2016  . Ejection fraction     Past Surgical History  Procedure Laterality Date  . Left heart catheterization with coronary angiogram N/A 08/13/2014    Procedure: LEFT HEART CATHETERIZATION WITH CORONARY ANGIOGRAM;  Surgeon: Kathleene Hazel, MD;  Location: [facility] CATH LAB;  Service: Cardiovascular;  Laterality: N/A;    Outpatient Prescriptions Prior to Visit  Medication Sig Dispense Refill  . acetaminophen (TYLENOL) 500 MG tablet Take 1,000 mg by mouth every 6 (six) hours as needed for mild pain.    Marland Kitchen aspirin 81 MG EC tablet Take 1 tablet (81 mg total) by mouth daily. 30 tablet 11  . bismuth subsalicylate (PEPTO BISMOL) 262 MG/15ML suspension Take 30 mLs by mouth every 6 (six) hours as needed for indigestion.    Marland Kitchen lisinopril (PRINIVIL,ZESTRIL) 20 MG tablet Take 1 tablet (20 mg total) by mouth daily. 30 tablet 11  . metoprolol tartrate (LOPRESSOR) 25 MG tablet Take 1 tablet (25 mg total)  by mouth 2 (two) times daily. 60 tablet 11  . nitroGLYCERIN (NITROSTAT) 0.4 MG SL tablet Place 1 tablet (0.4 mg total) under the tongue every 5 (five) minutes as needed for chest pain. 25 tablet 2  . pravastatin (PRAVACHOL) 40 MG tablet Take 1 tablet (40 mg total) by mouth daily. 30 tablet 11  . clopidogrel (PLAVIX) 75 MG tablet TAKE 1 TABLET BY MOUTH ONCE DAILY 30 tablet 2   No facility-administered medications prior to visit.     Allergies:   Review of patient's allergies indicates no known allergies.   Social History   Social History  . Marital Status: Legally Separated    Spouse Name: N/A  . Number of Children: N/A  . Years of Education: N/A   Social History Main Topics  . Smoking status: Former Smoker -- 1.00 packs/day    Types: Cigarettes    Quit date: 08/16/2014  . Smokeless tobacco: None  . Alcohol Use: No  . Drug Use: No  . Sexual Activity: Yes   Other Topics Concern  . None   Social History Narrative     Family History:  The patient's family history includes Heart attack in her father and mother; Hypertension in her maternal uncle and mother; Lupus in her sister. There is no history of Stroke.   ROS:   Please see the history of present illness.    ROS All other  systems reviewed and are negative.   PHYSICAL EXAM:   VS:  BP 124/74 mmHg  Pulse 64  Ht 5' (1.524 m)  Wt 150 lb (68.04 kg)  BMI 29.30 kg/m2   GEN: Well nourished, well developed, in no acute distress HEENT: normal Neck: no JVD, carotid bruits, or masses Cardiac: RRR; no murmurs, rubs, or gallops,no edema  Respiratory:  clear to auscultation bilaterally, normal work of breathing GI: soft, nontender, nondistended, + BS MS: no deformity or atrophy Skin: warm and dry, no rash Neuro:  Alert and Oriented x 3, Strength and sensation are intact Psych: euthymic mood, full affect  Wt Readings from Last 3 Encounters:  08/14/15 150 lb (68.04 kg)  02/13/15 147 lb (66.679 kg)  10/26/14 145 lb (65.772  kg)      Studies/Labs Reviewed:   EKG:  EKG is ordered today.  The ekg ordered today demonstrates Sinus rhythm, 64, nonspecific ST-T wave changes, artifact on EKG.  Recent Labs: 09/18/2014: ALT 24   Lipid Panel    Component Value Date/Time   CHOL 178 09/18/2014 0958   TRIG 93.0 09/18/2014 0958   HDL 47.20 09/18/2014 0958   CHOLHDL 4 09/18/2014 0958   VLDL 18.6 09/18/2014 0958   LDLCALC 112* 09/18/2014 0958    Additional studies/ records that were reviewed today include:  Office notes reviewed, prior EKGs reviewed, lab work reviewed    ASSESSMENT:    1. Coronary artery disease due to lipid rich plaque   2. Tobacco abuse   3. Hyperlipidemia   4. Essential hypertension, benign      PLAN:  In order of problems listed above:  Coronary artery disease -Stable post DES to RCA in March 2016. It is been one year. We will stop her Plavix 75 mg. Continue aspirin 81 mg. Watch for any signs of dysfunctional uterine bleeding.  History of MI -No anginal symptoms. Doing well.  Hyperlipidemia -Continuing with Pravachol. Dr. Myrtis SerKatz stated that he was have liked her on a more potent statin but finances were limited.  Former smoker  We will help her get set up with primary physician.  Medication Adjustments/Labs and Tests Ordered: Current medicines are reviewed at length with the patient today.  Concerns regarding medicines are outlined above.  Medication changes, Labs and Tests ordered today are listed in the Patient Instructions below. Patient Instructions  Your physician has recommended you make the following change in your medication:  1.) STOP PLAVIX (CLOPIDIGREL)  You [facility] NEEDS  Your physician wants you to follow-up in: 1 YEAR WITH DR. Anne FuSKAINS.  You will receive a reminder letter in the mail two months in advance. If you don't receive a letter, please call our office to schedule the follow-up appointment.         Mathews RobinsonsSigned, SKAINS, MARK, MD  08/14/2015 11:17 AM    [facility] [address], [place], [place]  [address] Phone: [phone]; Fax: [phone]

## 2015-08-14 NOTE — Patient Instructions (Signed)
Your physician has recommended you make the following change in your medication:  1.) STOP PLAVIX (CLOPIDIGREL)  You have been referred to [facility]ARE NEEDS  Your physician wants you to follow-up in: 1 YEAR WITH DR. Anne FuSKAINS.  You will receive a reminder letter in the mail two months in advance. If you don't receive a letter, please call our office to schedule the follow-up appointment.

## 2015-09-16 MED FILL — PRAVASTATIN NA 40 MG TAB: 40 | 30 days supply | Qty: 30 | Fill #9

## 2015-09-16 MED FILL — LISINOPRIL 20 MG TABLET: 20 | 30 days supply | Qty: 30 | Fill #9

## 2015-09-16 MED FILL — ?METOPROLOL 25 MG TABLET: 25 | 30 days supply | Qty: 60 | Fill #9

## 2015-10-14 MED FILL — LISINOPRIL 20 MG TABLET: 20 | 30 days supply | Qty: 30 | Fill #10

## 2015-10-14 MED FILL — METOPROLOL TARTRATE 25 MG T: 25 | 30 days supply | Qty: 60 | Fill #10

## 2015-10-14 MED FILL — PRAVASTATIN NA 40 MG TAB: 40 | 30 days supply | Qty: 30 | Fill #10

## 2015-11-12 ENCOUNTER — Other Ambulatory Visit: Payer: Self-pay | Admitting: Family Medicine

## 2015-11-14 ENCOUNTER — Other Ambulatory Visit: Payer: Self-pay | Admitting: Cardiology

## 2015-11-14 DIAGNOSIS — I10 Essential (primary) hypertension: Secondary | ICD-10-CM

## 2015-11-14 MED ORDER — NITROGLYCERIN 0.4 MG SL SUBL: 0.4000 mg | SUBLINGUAL_TABLET | SUBLINGUAL | Status: DC | PRN

## 2015-11-14 MED ORDER — LISINOPRIL 20 MG PO TABS: 20.0000 mg | ORAL_TABLET | Freq: Every day | ORAL | Status: DC

## 2015-11-14 MED ORDER — METOPROLOL TARTRATE 25 MG PO TABS: 25.0000 mg | ORAL_TABLET | Freq: Two times a day (BID) | ORAL | Status: DC

## 2015-11-14 MED FILL — LISINOPRIL 20 MG TABLET: 20 | 30 days supply | Qty: 30 | Fill #0

## 2015-11-14 MED FILL — ?METOPROLOL 25 MG TABLET: 25 | 30 days supply | Qty: 60 | Fill #0

## 2015-11-14 MED FILL — NITROSTAT 0.4 MG TABLET SL: 0.4 | 7 days supply | Qty: 25 | Fill #0

## 2015-12-17 ENCOUNTER — Other Ambulatory Visit: Payer: Self-pay | Admitting: *Deleted

## 2015-12-17 DIAGNOSIS — I10 Essential (primary) hypertension: Secondary | ICD-10-CM

## 2015-12-17 MED ORDER — PRAVASTATIN SODIUM 40 MG PO TABS: 40.0000 mg | ORAL_TABLET | Freq: Every day | ORAL | 11 refills | Status: DC

## 2015-12-17 MED FILL — ?PRAVASTATIN NA 40 MG TAB: 40 MG | 30 days supply | Qty: 30 | Fill #0

## 2015-12-17 MED FILL — ?LISINOPRIL 20 MG TABLET: 20 | 30 days supply | Qty: 30 | Fill #1

## 2015-12-17 MED FILL — METOPROLOL TARTRATE 25 MG T: 25 | 30 days supply | Qty: 60 | Fill #1

## 2016-01-22 MED FILL — ?PRAVASTATIN NA 40 MG TAB: 40 MG | 30 days supply | Qty: 30 | Fill #1

## 2016-01-22 MED FILL — ?LISINOPRIL 20 MG TABLET: 20 | 30 days supply | Qty: 30 | Fill #2

## 2016-01-22 MED FILL — ?METOPROLOL TARTRATE 25 MG: 25 | 30 days supply | Qty: 60 | Fill #2

## 2016-03-02 MED FILL — PRAVASTATIN NA 40 MG TAB: 40 | 30 days supply | Qty: 30 | Fill #2

## 2016-03-02 MED FILL — ?METOPROLOL TARTRATE 25 MG: 25 | 30 days supply | Qty: 60 | Fill #3

## 2016-03-02 MED FILL — ?LISINOPRIL 20 MG TABLET: 20 | 30 days supply | Qty: 30 | Fill #3

## 2016-04-20 MED FILL — METOPROLOL TARTRATE 25 MG T: 25 | 30 days supply | Qty: 60 | Fill #4

## 2016-04-20 MED FILL — ?LISINOPRIL 20 MG TABLET: 20 | 30 days supply | Qty: 30 | Fill #4

## 2016-04-20 MED FILL — ?PRAVASTATIN NA 40 MG TAB: 40 MG | 30 days supply | Qty: 30 | Fill #3

## 2016-05-26 MED FILL — METOPROLOL TARTRATE 25 MG T: 25 | 30 days supply | Qty: 60 | Fill #5

## 2016-05-26 MED FILL — ?LISINOPRIL 20 MG TABLET: 20 | 30 days supply | Qty: 30 | Fill #5

## 2016-05-26 MED FILL — ?PRAVASTATIN NA 40 MG TAB: 40 MG | 30 days supply | Qty: 30 | Fill #4

## 2016-07-06 MED FILL — PRAVASTATIN NA 40 MG TAB: 40 | 30 days supply | Qty: 30 | Fill #5

## 2016-07-06 MED FILL — ?METOPROLOL 25 MG TABLET: 25 | 30 days supply | Qty: 60 | Fill #6

## 2016-07-06 MED FILL — ?LISINOPRIL 20 MG TABLET: 20 | 30 days supply | Qty: 30 | Fill #6

## 2016-07-10 ENCOUNTER — Encounter: Payer: Self-pay | Admitting: Cardiology

## 2016-08-13 ENCOUNTER — Ambulatory Visit: Payer: Self-pay | Admitting: Cardiology

## 2016-08-18 ENCOUNTER — Encounter: Payer: Self-pay | Admitting: Cardiology

## 2016-08-18 ENCOUNTER — Encounter (INDEPENDENT_AMBULATORY_CARE_PROVIDER_SITE_OTHER): Payer: Self-pay

## 2016-08-18 ENCOUNTER — Ambulatory Visit (INDEPENDENT_AMBULATORY_CARE_PROVIDER_SITE_OTHER): Payer: Self-pay | Admitting: Cardiology

## 2016-08-18 VITALS — BP 158/100 | HR 70 | Ht 60.0 in | Wt 153.0 lb

## 2016-08-18 DIAGNOSIS — Z87891 Personal history of nicotine dependence: Secondary | ICD-10-CM

## 2016-08-18 DIAGNOSIS — E7800 Pure hypercholesterolemia, unspecified: Secondary | ICD-10-CM | POA: Insufficient documentation

## 2016-08-18 DIAGNOSIS — I252 Old myocardial infarction: Secondary | ICD-10-CM

## 2016-08-18 DIAGNOSIS — Z79899 Other long term (current) drug therapy: Secondary | ICD-10-CM

## 2016-08-18 DIAGNOSIS — I2510 Atherosclerotic heart disease of native coronary artery without angina pectoris: Secondary | ICD-10-CM

## 2016-08-18 DIAGNOSIS — I10 Essential (primary) hypertension: Secondary | ICD-10-CM

## 2016-08-18 DIAGNOSIS — I2583 Coronary atherosclerosis due to lipid rich plaque: Secondary | ICD-10-CM

## 2016-08-18 LAB — COMPREHENSIVE METABOLIC PANEL
A/G RATIO: 1.4 (ref 1.2–2.2)
ALT: 17 IU/L (ref 0–32)
AST: 18 IU/L (ref 0–40)
Albumin: 4.7 g/dL (ref 3.6–4.8)
Alkaline Phosphatase: 87 IU/L (ref 39–117)
BUN/Creatinine Ratio: 16 (ref 12–28)
BUN: 10 mg/dL (ref 8–27)
Bilirubin Total: 0.4 mg/dL (ref 0.0–1.2)
CALCIUM: 10.1 mg/dL (ref 8.7–10.3)
CO2: 22 mmol/L (ref 18–29)
Chloride: 101 mmol/L (ref 96–106)
Creatinine, Ser: 0.63 mg/dL (ref 0.57–1.00)
GFR calc Af Amer: 110 mL/min/{1.73_m2} (ref >59)
GFR, EST NON AFRICAN AMERICAN: 96 mL/min/{1.73_m2} (ref >59)
GLUCOSE: 95 mg/dL (ref 65–99)
Globulin, Total: 3.3 g/dL (ref 1.5–4.5)
POTASSIUM: 4.3 mmol/L (ref 3.5–5.2)
Sodium: 142 mmol/L (ref 134–144)
TOTAL PROTEIN: 8 g/dL (ref 6.0–8.5)

## 2016-08-18 LAB — LIPID PANEL
Chol/HDL Ratio: 3.7 ratio units (ref 0.0–4.4)
Cholesterol, Total: 185 mg/dL (ref 100–199)
HDL: 50 mg/dL (ref >39)
LDL Calculated: 107 mg/dL — ABNORMAL HIGH (ref 0–99)
Triglycerides: 138 mg/dL (ref 0–149)
VLDL Cholesterol Cal: 28 mg/dL (ref 5–40)

## 2016-08-18 LAB — CBC
HEMATOCRIT: 39.5 % (ref 34.0–46.6)
HEMOGLOBIN: 13.8 g/dL (ref 11.1–15.9)
MCH: 31.2 pg (ref 26.6–33.0)
MCHC: 34.9 g/dL (ref 31.5–35.7)
MCV: 89 fL (ref 79–97)
PLATELETS: 287 10*3/uL (ref 150–379)
RBC: 4.43 x10E6/uL (ref 3.77–5.28)
RDW: 15.3 % (ref 12.3–15.4)
WBC: 4.8 10*3/uL (ref 3.4–10.8)

## 2016-08-18 MED ORDER — LISINOPRIL 20 MG PO TABS: 20.0000 mg | ORAL_TABLET | Freq: Every day | ORAL | 11 refills | Status: DC

## 2016-08-18 MED ORDER — METOPROLOL TARTRATE 25 MG PO TABS: 25.0000 mg | ORAL_TABLET | Freq: Two times a day (BID) | ORAL | 11 refills | Status: DC

## 2016-08-18 MED ORDER — PRAVASTATIN SODIUM 40 MG PO TABS: 40.0000 mg | ORAL_TABLET | Freq: Every day | ORAL | 11 refills | Status: DC

## 2016-08-18 MED ORDER — NITROGLYCERIN 0.4 MG SL SUBL: 0.4000 mg | SUBLINGUAL_TABLET | SUBLINGUAL | 3 refills | Status: DC | PRN

## 2016-08-18 NOTE — Patient Instructions (Signed)
Medication Instructions:  The current medical regimen is effective;  continue present plan and medications.  Labwork: Please have blood work today (CBC, BMP and lipid)  Follow-Up: Follow up in 1 year with Dr. Anne FuSkains.  You will receive a letter in the mail 2 months before you are due.  Please call us when you receive this letter to schedule your follow up appointment.  If you need a refill on your cardiac medications before your next appointment, please call your pharmacy.  Thank you for choosing [facility]!!     Please obtain a Primary Care Doctor!

## 2016-08-18 NOTE — Progress Notes (Signed)
Cardiology Office Note    Date:  08/18/2016   ID:  Kim Brady, DOB December 29, 1955, MRN [id number]  PCP:  No PCP Per Patient  Cardiologist:   Kim Schultz, MD (Former Kim Brady)    History of Present Illness:  Kim Brady is a 61 y.o. female former patient of Kim Brady with prior non-ST elevation myocardial infarction in March 2016, DES  to mid RCA, doing well.   At some point in the past year she had been off of aspirin and Plavix according to bio flow research study coordinator, Kim Brady.   She still has not got no primary care physician. She has very rare atypical fleeting chest discomfort. No shortness of breath, no syncope, no bleeding. Previously she noted some mild dysfunctional uterine bleeding but this seems to have stopped.      Past Medical History:  Diagnosis Date  . Chronic kidney disease   . Ejection fraction   . Essential hypertension Dx March 2016  . NSTEMI (non-ST elevated myocardial infarction) (HCC) 08/10/2014   Inferior, with drug-eluting stent to the RCA    Past Surgical History:  Procedure Laterality Date  . LEFT HEART CATHETERIZATION WITH CORONARY ANGIOGRAM N/A 08/13/2014   Procedure: LEFT HEART CATHETERIZATION WITH CORONARY ANGIOGRAM;  Surgeon: Kim Hazel, MD;  Location: [facility] CATH LAB;  Service: Cardiovascular;  Laterality: N/A;    Outpatient Medications Prior to Visit  Medication Sig Dispense Refill  . acetaminophen (TYLENOL) 500 MG tablet Take 1,000 mg by mouth every 6 (six) hours as needed for mild pain.    Marland Kitchen aspirin 81 MG EC tablet Take 1 tablet (81 mg total) by mouth daily. 30 tablet 11  . bismuth subsalicylate (PEPTO BISMOL) 262 MG/15ML suspension Take 30 mLs by mouth every 6 (six) hours as needed for indigestion.    Marland Kitchen lisinopril (PRINIVIL,ZESTRIL) 20 MG tablet Take 1 tablet (20 mg total) by mouth daily. 30 tablet 8  . metoprolol tartrate (LOPRESSOR) 25 MG tablet Take 1 tablet (25 mg total) by mouth 2 (two) times daily. 60 tablet 8  .  nitroGLYCERIN (NITROSTAT) 0.4 MG SL tablet Place 1 tablet (0.4 mg total) under the tongue every 5 (five) minutes as needed for chest pain. 25 tablet 3  . pravastatin (PRAVACHOL) 40 MG tablet Take 1 tablet (40 mg total) by mouth daily. 30 tablet 11   No facility-administered medications prior to visit.      Allergies:   Patient has no known allergies.   Social History   Social History  . Marital status: Legally Separated    Spouse name: N/A  . Number of children: N/A  . Years of education: N/A   Social History Main Topics  . Smoking status: Former Smoker    Packs/day: 1.00    Types: Cigarettes    Quit date: 08/16/2014  . Smokeless tobacco: Not on file  . Alcohol use No  . Drug use: No  . Sexual activity: Yes   Other Topics Concern  . Not on file   Social History Narrative  . No narrative on file     Family History:  The patient's family history includes Heart attack in her father and mother; Hypertension in her maternal uncle and mother; Lupus in her sister.   ROS:   Please see the history of present illness.    ROS All other systems reviewed and are negative.   PHYSICAL EXAM:   VS:  BP (!) 158/100   Pulse 70   Ht 5' (1.524  m)   Wt 153 lb (69.4 kg)   BMI 29.88 kg/m    GEN: Well nourished, well developed, in no acute distress HEENT: normal Neck: no JVD, carotid bruits, or masses Cardiac: RRR; no murmurs, rubs, or gallops,no edema  Respiratory:  clear to auscultation bilaterally, normal work of breathing GI: soft, nontender, nondistended, + BS MS: no deformity or atrophy Skin: warm and dry, no rash Neuro:  Alert and Oriented x 3, Strength and sensation are intact Psych: euthymic mood, full affect  No change in physical exam from last year.  Wt Readings from Last 3 Encounters:  08/18/16 153 lb (69.4 kg)  08/14/15 150 lb (68 kg)  02/13/15 147 lb (66.7 kg)      Studies/Labs Reviewed:   EKG:  EKG is ordered today.  08/18/16-sinus rhythm 70 no other  significant abnormalities, subtle ST segment change in lead 3. Personally viewed-prior  Sinus rhythm, 64, nonspecific ST-T wave changes, artifact on EKG.  Recent Labs: No results found for requested labs within last 8760 hours.   Lipid Panel    Component Value Date/Time   CHOL 178 09/18/2014 0958   TRIG 93.0 09/18/2014 0958   HDL 47.20 09/18/2014 0958   CHOLHDL 4 09/18/2014 0958   VLDL 18.6 09/18/2014 0958   LDLCALC 112 (H) 09/18/2014 0958    Additional studies/ records that were reviewed today include:  Office notes reviewed, prior EKGs reviewed, lab work reviewed    ASSESSMENT:    1. Coronary artery disease due to lipid rich plaque   2. Pure hypercholesterolemia   3. Essential hypertension, benign   4. History of MI (myocardial infarction)   5. Former smoker   6. Encounter for long-term current use of medication      PLAN:  In order of problems listed above:  Coronary artery disease -Stable post DES to RCA in March 2016.   Continue aspirin 81 mg. Watch for any signs of dysfunctional uterine bleeding.Doing very well since being off of the Plavix.  History of MI -No anginal symptoms. Doing well.Very rarely does she have any atypical chest discomfort.  Hyperlipidemia -Continuing with Pravachol. Kim Brady stated that he was have liked her on a more potent statin but finances were limited.Continue with current medication.  Former smoker  - Doing well.  Essential hypertension  - Pressure prior visit was normal. Is elevated today. We will refill her medications.  - We will check lab work. We will help her get set up with primary physician, discussed the/tear.  Medication Adjustments/Labs and Tests Ordered: Current medicines are reviewed at length with the patient today.  Concerns regarding medicines are outlined above.  Medication changes, Labs and Tests ordered today are listed in the Patient Instructions below. Patient Instructions  Medication Instructions:  The  current medical regimen is effective;  continue present plan and medications.  Labwork: Please have blood work today (CBC, BMP and lipid)  Follow-Up: Follow up in 1 year with Kim Brady.  You will receive a letter in the mail 2 months before you are due.  Please call us when you receive this letter to schedule your follow up appointment.  If you need a refill on your cardiac medications before your next appointment, please call your pharmacy.  Thank you for choosing [facility]!!     Please obtain a Primary Care Doctor!       Signed, Kim SchultzMark Sarayu Prevost, MD  08/18/2016 9:43 AM    [facility] [address],  [place], [place]  [address] Phone: [phone]; Fax: [phone]

## 2016-08-19 ENCOUNTER — Other Ambulatory Visit: Payer: Self-pay | Admitting: *Deleted

## 2016-08-19 MED ORDER — ATORVASTATIN CALCIUM 40 MG PO TABS: 40.0000 mg | ORAL_TABLET | Freq: Every day | ORAL | 11 refills | Status: DC

## 2016-08-19 MED FILL — ATORVASTATIN 40 MG TABLET: 40 | 30 days supply | Qty: 30 | Fill #0

## 2016-08-19 MED FILL — PRAVASTATIN NA 40 MG TAB: 40 | 30 days supply | Qty: 30 | Fill #6

## 2016-08-19 MED FILL — ?LISINOPRIL 20 MG TABLET: 20 | 30 days supply | Qty: 30 | Fill #7

## 2016-08-19 MED FILL — ?METOPROLOL 25 MG TABLET: 25 | 30 days supply | Qty: 60 | Fill #7

## 2016-08-20 ENCOUNTER — Telehealth: Payer: Self-pay | Admitting: Cardiology

## 2016-08-20 NOTE — Telephone Encounter (Signed)
Attempted to call pt back however received a recording that "at the subscribers request this phone does not accept incoming calls"  Pt does not need to take both and was instructed to take only the new RX for atorvastatin yesterday.

## 2016-08-20 NOTE — Telephone Encounter (Signed)
Pt aware to just take the Atorvastatin and not pravastatin.

## 2016-08-20 NOTE — Telephone Encounter (Signed)
Mrs. Kim Brady is calling about her Atorvastatin and Pravastatin. She is wanting to know should she take both of them . Please call

## 2016-09-03 MED FILL — NITROSTAT 0.4 MG TABLET SL: 0.4 | 25 days supply | Qty: 25 | Fill #0

## 2016-10-07 ENCOUNTER — Other Ambulatory Visit: Payer: Self-pay | Admitting: Cardiology

## 2016-10-07 DIAGNOSIS — I10 Essential (primary) hypertension: Secondary | ICD-10-CM

## 2016-10-07 MED FILL — ATORVASTATIN 40 MG TABLET: 40 | 30 days supply | Qty: 30 | Fill #1

## 2016-10-07 MED FILL — METOPROLOL TARTRATE 25 MG T: 25 | 30 days supply | Qty: 60 | Fill #8

## 2016-10-07 MED FILL — ?LISINOPRIL 20 MG TABLET: 20 | 30 days supply | Qty: 30 | Fill #8

## 2016-11-16 ENCOUNTER — Other Ambulatory Visit: Payer: Self-pay | Admitting: Cardiology

## 2016-11-16 DIAGNOSIS — I10 Essential (primary) hypertension: Secondary | ICD-10-CM

## 2016-11-16 MED FILL — ATORVASTATIN 40 MG TABLET: 40 | 30 days supply | Qty: 30 | Fill #2

## 2016-11-17 MED FILL — ?LISINOPRIL 20 MG TABLET: 20 | 30 days supply | Qty: 30 | Fill #0

## 2016-11-17 MED FILL — METOPROLOL TARTRATE 25 MG T: 25 | 30 days supply | Qty: 60 | Fill #0

## 2016-12-28 MED FILL — ?ATORVASTATIN 40MG TABLET: 40 | 30 days supply | Qty: 30 | Fill #3

## 2016-12-28 MED FILL — METOPROLOL TARTRATE 25 MG T: 25 | 30 days supply | Qty: 60 | Fill #0

## 2017-02-17 MED FILL — LISINOPRIL 20 MG TAB: 20 | 30 days supply | Qty: 30 | Fill #1

## 2017-02-17 MED FILL — ?ATORVASTATIN 40MG TABLET: 40 | 30 days supply | Qty: 30 | Fill #4

## 2017-02-17 MED FILL — METOPROLOL TARTRATE 25 MG T: 25 | 30 days supply | Qty: 60 | Fill #1

## 2017-03-22 MED FILL — ?METOPROLOL 25 MG TABLET: 25 | 30 days supply | Qty: 60 | Fill #2

## 2017-03-22 MED FILL — ?ATORVASTATIN 40MG TABLET: 40 | 30 days supply | Qty: 30 | Fill #5

## 2017-03-22 MED FILL — LISINOPRIL 20 MG TAB: 20 | 30 days supply | Qty: 30 | Fill #2

## 2017-03-29 ENCOUNTER — Emergency Department (HOSPITAL_COMMUNITY)
Admission: EM | Admit: 2017-03-29 | Discharge: 2017-03-29 | Disposition: A | Discharge status: Home / Self Care | Payer: Self-pay | Attending: Emergency Medicine | Admitting: Emergency Medicine

## 2017-03-29 ENCOUNTER — Encounter (HOSPITAL_COMMUNITY): Payer: Self-pay | Admitting: Emergency Medicine

## 2017-03-29 ENCOUNTER — Emergency Department (HOSPITAL_COMMUNITY): Payer: Self-pay

## 2017-03-29 DIAGNOSIS — I2510 Atherosclerotic heart disease of native coronary artery without angina pectoris: Secondary | ICD-10-CM | POA: Insufficient documentation

## 2017-03-29 DIAGNOSIS — M79601 Pain in right arm: Secondary | ICD-10-CM | POA: Insufficient documentation

## 2017-03-29 DIAGNOSIS — I129 Hypertensive chronic kidney disease with stage 1 through stage 4 chronic kidney disease, or unspecified chronic kidney disease: Secondary | ICD-10-CM | POA: Insufficient documentation

## 2017-03-29 DIAGNOSIS — Z79899 Other long term (current) drug therapy: Secondary | ICD-10-CM | POA: Insufficient documentation

## 2017-03-29 DIAGNOSIS — N189 Chronic kidney disease, unspecified: Secondary | ICD-10-CM | POA: Insufficient documentation

## 2017-03-29 DIAGNOSIS — I252 Old myocardial infarction: Secondary | ICD-10-CM | POA: Insufficient documentation

## 2017-03-29 DIAGNOSIS — Z7982 Long term (current) use of aspirin: Secondary | ICD-10-CM | POA: Insufficient documentation

## 2017-03-29 DIAGNOSIS — F17210 Nicotine dependence, cigarettes, uncomplicated: Secondary | ICD-10-CM | POA: Insufficient documentation

## 2017-03-29 DIAGNOSIS — R0789 Other chest pain: Secondary | ICD-10-CM | POA: Insufficient documentation

## 2017-03-29 LAB — BASIC METABOLIC PANEL
ANION GAP: 8 (ref 5–15)
BUN: 8 mg/dL (ref 6–20)
CALCIUM: 9.9 mg/dL (ref 8.9–10.3)
CO2: 25 mmol/L (ref 22–32)
Chloride: 106 mmol/L (ref 101–111)
Creatinine, Ser: 0.71 mg/dL (ref 0.44–1.00)
Glucose, Bld: 107 mg/dL — ABNORMAL HIGH (ref 65–99)
POTASSIUM: 3.3 mmol/L — AB (ref 3.5–5.1)
Sodium: 139 mmol/L (ref 135–145)

## 2017-03-29 LAB — CBC
HEMATOCRIT: 37.4 % (ref 36.0–46.0)
HEMOGLOBIN: 12.8 g/dL (ref 12.0–15.0)
MCH: 29.7 pg (ref 26.0–34.0)
MCHC: 34.2 g/dL (ref 30.0–36.0)
MCV: 86.8 fL (ref 78.0–100.0)
Platelets: 248 10*3/uL (ref 150–400)
RBC: 4.31 MIL/uL (ref 3.87–5.11)
RDW: 14.7 % (ref 11.5–15.5)
WBC: 7 10*3/uL (ref 4.0–10.5)

## 2017-03-29 LAB — I-STAT TROPONIN, ED
TROPONIN I, POC: 0 ng/mL (ref 0.00–0.08)
TROPONIN I, POC: 0 ng/mL (ref 0.00–0.08)

## 2017-03-29 MED ORDER — POTASSIUM CHLORIDE CRYS ER 20 MEQ PO TBCR
40.0000 meq | EXTENDED_RELEASE_TABLET | Freq: Once | ORAL | Status: AC
  Administered 2017-03-29: 40 meq via ORAL
  Filled 2017-03-29: qty 2

## 2017-03-29 NOTE — ED Notes (Signed)
Denies chest pain at this time.

## 2017-03-29 NOTE — Discharge Instructions (Signed)
Take tylenol 1000mg (2 extra strength) four times a day.   Try not to lift anything heavy.  Follow up with your family doc.

## 2017-03-29 NOTE — ED Triage Notes (Signed)
Cp and dizziness x 1 week , no n/v/sob but has a cough

## 2017-03-29 NOTE — ED Provider Notes (Signed)
[facility] EMERGENCY DEPARTMENT Provider Note   CSN: [id number] Arrival date & time: 03/29/17  0827   History   Chief Complaint Chief Complaint  Patient presents with  . Chest Pain    HPI Kim Brady is a 61 y.o. female.  Patient with history of CAD (s/p NSTEMI, DES to RCA 2016), HLD, HTN, and CKD presenting with 1 week of right shoulder pain / right chest pain. The pain is located in her Right shoulder, right anterior axilla, and right scapula and radiates down her right arm. The pain is described as constant 5/10, aching pain, that either radiates to her right arm and hand or that is present in the right arm also. The pain is worse with internal rotation and extension of her right arm. The pain is also worse if she lays on her right side (it gets as bad as 9/10). She has taken tylenol and baby Asprin for it along with rubbing alcohol on it, all of which has helped some. 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 She endorses an episode of dizziness in the grocery store early last week, but has had no episodes since that time. She also endorses feeling some chills at night. She denies fever.      Past Medical History:  Diagnosis Date  . Chronic kidney disease   . Ejection fraction   . Essential hypertension Dx March 2016  . NSTEMI (non-ST elevated myocardial infarction) (HCC) 08/10/2014   Inferior, with drug-eluting stent to the RCA    Patient Active Problem List   Diagnosis Date Noted  . Pure hypercholesterolemia 08/18/2016  . History of MI (myocardial infarction) 08/18/2016  . Former smoker 08/18/2016  . Coronary artery disease due to lipid rich plaque 02/11/2015  . Shortness of breath 02/11/2015  . Hyperlipidemia 02/11/2015  . Nosebleed 02/11/2015  . Ejection fraction   . Healthcare maintenance 10/26/2014  . Screening for HIV (human  immunodeficiency virus) 10/26/2014  . Essential hypertension, benign 08/16/2014  . Non-STEMI (non-ST elevated myocardial infarction) (HCC) 08/11/2014  . Tobacco abuse 08/11/2014    History reviewed. No pertinent surgical history.  OB History    No data available       Home Medications    Prior to Admission medications   Medication Sig Start Date End Date Taking? Authorizing Provider  acetaminophen (TYLENOL) 500 MG tablet Take 1,000 mg by mouth every 6 (six) hours as needed for mild pain.   Yes [provider]  aspirin 81 MG EC tablet Take 1 tablet (81 mg total) by mouth daily. 10/26/14  Yes Funches, Josalyn, MD  atorvastatin (LIPITOR) 40 MG tablet Take 1 tablet (40 mg total) by mouth daily. 08/19/16 03/29/17 Yes Jake Bathe, MD  bismuth subsalicylate (PEPTO BISMOL) 262 MG/15ML suspension Take 30 mLs by mouth every 6 (six) hours as needed for indigestion.   Yes [provider]  lisinopril (PRINIVIL,ZESTRIL) 20 MG tablet Take 1 tablet (20 mg total) by mouth daily. 08/18/16  Yes Jake Bathe, MD  metoprolol tartrate (LOPRESSOR) 25 MG tablet Take 1 tablet (25 mg total) by mouth 2 (two) times daily. 08/18/16  Yes Jake Bathe, MD  lisinopril (PRINIVIL,ZESTRIL) 20 MG tablet TAKE 1 TABLET BY MOUTH DAILY. Patient not taking: Reported on 03/29/2017 11/17/16   Jake Bathe, MD  metoprolol tartrate (LOPRESSOR) 25 MG tablet TAKE 1 TABLET BY MOUTH  2 TIMES DAILY. Patient not taking: Reported on 03/29/2017 11/17/16   Jake Bathe, MD  nitroGLYCERIN (NITROSTAT) 0.4 MG SL tablet Place 1 tablet (0.4 mg total) under the tongue every 5 (five) minutes as needed for chest pain. 08/18/16   Jake Bathe, MD    Family History Family History  Problem Relation Age of Onset  . Heart attack Mother   . Hypertension Mother   . Heart attack Father   . Lupus Sister   . Hypertension Maternal Uncle   . Stroke Neg Hx     Social History Social History   Tobacco Use  . Smoking status:  Current Every Day Smoker    Packs/day: 1.00    Types: Cigarettes    Last attempt to quit: 08/16/2014    Years since quitting: 2.6  . Smokeless tobacco: Never Used  Substance Use Topics  . Alcohol use: No    Alcohol/week: 0.0 oz  . Drug use: No     Allergies   Patient has no known allergies.   Review of Systems Review of Systems  Constitutional: Positive for chills. Negative for fever.  HENT: Negative for ear pain and sore throat.   Eyes: Negative for pain and visual disturbance.  Respiratory: Negative for cough and shortness of breath.   Cardiovascular: Negative for chest pain and palpitations.  Gastrointestinal: Negative for abdominal pain and vomiting.  Genitourinary: Negative for dysuria and hematuria.  Musculoskeletal: Positive for arthralgias and back pain.  Skin: Negative for color change and rash.  Neurological: Negative for seizures and syncope.  All other systems reviewed and are negative.    Physical Exam Updated Vital Signs BP (!) 185/98   Pulse 77   Temp 98.6 F (37 C) (Oral)   Resp (!) 22   Ht 5' (1.524 m)   Wt 67.6 kg (149 lb)   SpO2 96%   BMI 29.10 kg/m   Physical Exam  Constitutional: She appears well-developed and well-nourished.  HENT:  Head: Normocephalic and atraumatic.  Eyes: EOM are normal. Right eye exhibits no discharge. Left eye exhibits no discharge.  Cardiovascular: Normal rate and regular rhythm.  Systolic murmur  Pulmonary/Chest: Effort normal and breath sounds normal. No respiratory distress.  Abdominal: Soft. Bowel sounds are normal. She exhibits no distension. There is no tenderness.  Musculoskeletal: She exhibits no edema or deformity.  Full range of motion RUE Pain with internal rotation RUE Pain with extension >90 degrees RUE Tenderness to palpation of scapula, distal pectoralis region  Neurological: She is alert. Coordination normal.  Skin: Skin is warm and dry.  Psychiatric: She has a normal mood and affect.     ED  Treatments / Results  Labs (all labs ordered are listed, but only abnormal results are displayed) Labs Reviewed  BASIC METABOLIC PANEL - Abnormal; Notable for the following components:      Result Value   Potassium 3.3 (*)    Glucose, Bld 107 (*)    All other components within normal limits  CBC  I-STAT TROPONIN, ED  I-STAT TROPONIN, ED    EKG  EKG Interpretation  Date/Time:  Monday March 29 2017 08:32:27 EST Ventricular Rate:  89 PR Interval:  128 QRS Duration: 74 QT Interval:  386 QTC Calculation: 469 R Axis:   49 Text Interpretation:  Normal sinus rhythm T wave abnormality, consider inferior ischemia Abnormal ECG flipped t waves in inferior leads less pronounce than on prior Otherwise no significant change Confirmed by Melene Plan [phone]) on 03/29/2017 12:15:00  PM       Radiology Dg Chest 2 View  Result Date: 03/29/2017 CLINICAL DATA:  Right chest pain today. EXAM: CHEST  2 VIEW COMPARISON:  PA and lateral chest 08/11/2014. FINDINGS: The lungs are clear. Heart size is normal. No pneumothorax or pleural fluid. No bony abnormality. IMPRESSION: Negative chest. Electronically Signed   By: Drusilla Kannerhomas  Dalessio M.D.   On: 03/29/2017 09:01    Procedures Procedures (including critical care time)  Medications Ordered in ED Medications  potassium chloride SA (K-DUR,KLOR-CON) CR tablet 40 mEq (40 mEq Oral Given 03/29/17 1322)     Initial Impression / Assessment and Plan / ED Course  I have reviewed the triage vital signs and the nursing notes.  Pertinent labs & imaging results that were available during my care of the patient were reviewed by me and considered in my medical decision making (see chart for details).    Patient with 1 week of RUE aching pain in the setting of recent increased house and yard work. Suspect musculoskeletal pain, but will cycle troponin given the patient's cardiac history. Initial laboratory workup significant for K 3.3. CXR normal. Will replete K. -  Repeat Troponin: negative  Atypical chest pain, suspect musculoskeletal etiology. Discharged with instruction to take extra strength tylenol, activity restriction, and follow up with family doctor.   Final Clinical Impressions(s) / ED Diagnoses   Final diagnoses:  Atypical chest pain    ED Discharge Orders    None       Beola CordMelvin, Arleene Settle, MD 03/29/17 1406    Melene PlanFloyd, Dan, DO 03/29/17 1422

## 2017-04-26 MED FILL — LISINOPRIL 20 MG TAB: 20 | 30 days supply | Qty: 30 | Fill #3

## 2017-04-26 MED FILL — ?ATORVASTATIN 40MG TABLET: 40 | 30 days supply | Qty: 30 | Fill #6

## 2017-04-26 MED FILL — METOPROLOL TARTRATE 25 MG T: 25 | 30 days supply | Qty: 60 | Fill #3

## 2017-05-31 MED FILL — ?METOPROLOL 25 MG TABLET: 25 | 30 days supply | Qty: 60 | Fill #4

## 2017-05-31 MED FILL — ?ATORVASTATIN 40MG TABLET: 40 | 30 days supply | Qty: 30 | Fill #7

## 2017-05-31 MED FILL — LISINOPRIL 20 MG TAB: 20 | 30 days supply | Qty: 30 | Fill #4

## 2017-06-28 ENCOUNTER — Ambulatory Visit: Payer: Self-pay | Attending: Internal Medicine

## 2017-07-12 MED FILL — ?ATORVASTATIN 40MG TABLET: 40 | 30 days supply | Qty: 30 | Fill #8

## 2017-07-12 MED FILL — ?METOPROLOL 25 MG TABLET: 25 | 30 days supply | Qty: 60 | Fill #5

## 2017-07-12 MED FILL — LISINOPRIL 20 MG TAB: 20 | 30 days supply | Qty: 30 | Fill #5

## 2017-07-16 ENCOUNTER — Encounter (INDEPENDENT_AMBULATORY_CARE_PROVIDER_SITE_OTHER): Payer: Self-pay | Admitting: Physician Assistant

## 2017-07-16 ENCOUNTER — Ambulatory Visit (INDEPENDENT_AMBULATORY_CARE_PROVIDER_SITE_OTHER): Payer: Self-pay | Admitting: Physician Assistant

## 2017-07-16 VITALS — BP 165/84 | HR 85 | Temp 97.7°F | Resp 18 | Ht 60.0 in | Wt 151.0 lb

## 2017-07-16 DIAGNOSIS — E7841 Elevated Lipoprotein(a): Secondary | ICD-10-CM

## 2017-07-16 DIAGNOSIS — I10 Essential (primary) hypertension: Secondary | ICD-10-CM

## 2017-07-16 DIAGNOSIS — K047 Periapical abscess without sinus: Secondary | ICD-10-CM

## 2017-07-16 MED ORDER — NITROGLYCERIN 0.4 MG SL SUBL: 0.4000 mg | SUBLINGUAL_TABLET | SUBLINGUAL | 3 refills | Status: AC | PRN

## 2017-07-16 MED ORDER — AMOXICILLIN-POT CLAVULANATE 875-125 MG PO TABS: 1.0000 | ORAL_TABLET | Freq: Two times a day (BID) | ORAL | 0 refills | Status: DC

## 2017-07-16 MED ORDER — ATORVASTATIN CALCIUM 40 MG PO TABS: 40.0000 mg | ORAL_TABLET | Freq: Every day | ORAL | 11 refills | Status: AC

## 2017-07-16 MED ORDER — ACETAMINOPHEN-CODEINE #3 300-30 MG PO TABS: 1.0000 | ORAL_TABLET | ORAL | 0 refills | Status: AC | PRN

## 2017-07-16 MED ORDER — METOPROLOL TARTRATE 25 MG PO TABS: 25.0000 mg | ORAL_TABLET | Freq: Two times a day (BID) | ORAL | 11 refills | Status: AC

## 2017-07-16 MED ORDER — LISINOPRIL 40 MG PO TABS: 40.0000 mg | ORAL_TABLET | Freq: Every day | ORAL | 3 refills | Status: DC

## 2017-07-16 MED FILL — AMOX-CLAV 875-125 MG TABLET: 875-125 | 10 days supply | Qty: 20 | Fill #0

## 2017-07-16 MED FILL — NITROSTAT 0.4 MG TABLET SL: 0.4 | 25 days supply | Qty: 25 | Fill #0

## 2017-07-16 MED FILL — ACETAMINOPHEN/COD #3 TABLET: 300-30 | 7 days supply | Qty: 42 | Fill #0

## 2017-07-16 NOTE — Patient Instructions (Signed)
Dental Abscess A dental abscess is pus in or around a tooth. Follow these instructions at home:  Take medicines only as told by your dentist.  If you were prescribed antibiotic medicine, finish all of it even if you start to feel better.  Rinse your mouth (gargle) often with salt water.  Do not drive or use heavy machinery, like a lawn mower, while taking pain medicine.  Do not apply heat to the outside of your mouth.  Keep all follow-up visits as told by your dentist. This is important. Contact a doctor if:  Your pain is worse, and medicine does not help. Get help right away if:  You have a fever or chills.  Your symptoms suddenly get worse.  You have a very bad headache.  You have problems breathing or swallowing.  You have trouble opening your mouth.  You have puffiness (swelling) in your neck or around your eye. This information is not intended to replace advice given to you by your health care provider. Make sure you discuss any questions you have with your health care provider. Document Released: 09/25/2014 Document Revised: 10/17/2015 Document Reviewed: 05/08/2014 Elsevier Interactive Patient Education  2018 [employer]  

## 2017-07-16 NOTE — Progress Notes (Signed)
Subjective:  Patient ID: Kim Brady, female    DOB: December 02, 1955  Age: 62 y.o. MRN: [id number]  CC: Dental pain   HPI Kim Brady is a 62 y.o. female with a medical history of CAD, HLD, HTN, MI, tobacco abuse, and dental abscess presents as a new patient with complaint of dental pain. Has chronic dental pain with occasional abscess secondary to severe tooth decay and periodontitis. Currently with pain in the lower right molars associated with mild edema.    Does not check BP at home. Will be needing refills before she runs out. Does not endorse CP, palpitations, SOB, HA, abdominal pain, f/c/n/v, presyncope, syncope, weakness, tingling, numbness, rash, or GI/GU sxs.      Outpatient Medications Prior to Visit  Medication Sig Dispense Refill  . acetaminophen (TYLENOL) 500 MG tablet Take 1,000 mg by mouth every 6 (six) hours as needed for mild pain.    Marland Kitchen. aspirin 81 MG EC tablet Take 1 tablet (81 mg total) by mouth daily. 30 tablet 11  . atorvastatin (LIPITOR) 40 MG tablet Take 1 tablet (40 mg total) by mouth daily. 30 tablet 11  . bismuth subsalicylate (PEPTO BISMOL) 262 MG/15ML suspension Take 30 mLs by mouth every 6 (six) hours as needed for indigestion.    Marland Kitchen. lisinopril (PRINIVIL,ZESTRIL) 20 MG tablet Take 1 tablet (20 mg total) by mouth daily. 30 tablet 11  . metoprolol tartrate (LOPRESSOR) 25 MG tablet Take 1 tablet (25 mg total) by mouth 2 (two) times daily. 60 tablet 11  . nitroGLYCERIN (NITROSTAT) 0.4 MG SL tablet Place 1 tablet (0.4 mg total) under the tongue every 5 (five) minutes as needed for chest pain. 25 tablet 3  . lisinopril (PRINIVIL,ZESTRIL) 20 MG tablet TAKE 1 TABLET BY MOUTH DAILY. (Patient not taking: Reported on 03/29/2017) 30 tablet 8  . metoprolol tartrate (LOPRESSOR) 25 MG tablet TAKE 1 TABLET BY MOUTH 2 TIMES DAILY. (Patient not taking: Reported on 03/29/2017) 60 tablet 8   No facility-administered medications prior to visit.      ROS Review of Systems   Constitutional: Negative for chills, fever and malaise/fatigue.  HENT:       Tooth pain  Eyes: Negative for blurred vision.  Respiratory: Negative for shortness of breath.   Cardiovascular: Negative for chest pain and palpitations.  Gastrointestinal: Negative for abdominal pain and nausea.  Genitourinary: Negative for dysuria and hematuria.  Musculoskeletal: Negative for joint pain and myalgias.  Skin: Negative for rash.  Neurological: Negative for tingling and headaches.  Psychiatric/Behavioral: Negative for depression. The patient is not nervous/anxious.     Objective:  BP (!) 165/84 (BP Location: Right Arm, Patient Position: Sitting, Cuff Size: Normal)   Pulse 85   Temp 97.7 F (36.5 C) (Oral)   Resp 18   Ht 5' (1.524 m)   Wt 151 lb (68.5 kg)   SpO2 97%   BMI 29.49 kg/m   BP/Weight 07/16/2017 03/29/2017 08/18/2016  Systolic BP 165 162 158  Diastolic BP 84 84 100  Wt. (Lbs) 151 149 153  BMI 29.49 29.1 29.88      Physical Exam  Constitutional: She is oriented to person, place, and time.  Well developed, well nourished, NAD, polite  HENT:  Head: Normocephalic and atraumatic.  Severely decayed teeth, several missing teeth, severe periodontitis. Painful to open mouth completely  Eyes: No scleral icterus.  Neck: Normal range of motion. Neck supple. No thyromegaly present.  Cardiovascular: Normal rate, regular rhythm and normal heart sounds.  Pulmonary/Chest: Effort normal and breath sounds normal.  Musculoskeletal: She exhibits no edema.  Neurological: She is alert and oriented to person, place, and time. No cranial nerve deficit. Coordination normal.  Skin: Skin is warm and dry. No rash noted. No erythema. No pallor.  Psychiatric: She has a normal mood and affect. Her behavior is normal. Thought content normal.  Vitals reviewed.    Assessment & Plan:   1. Dental abscess - CBC with Differential - acetaminophen-codeine (TYLENOL #3) 300-30 MG tablet; Take 1 tablet  by mouth every 4 (four) hours as needed for up to 7 days for moderate pain.  Dispense: 42 tablet; Refill: 0 - amoxicillin-clavulanate (AUGMENTIN) 875-125 MG tablet; Take 1 tablet by mouth 2 (two) times daily.  Dispense: 20 tablet; Refill: 0 - Ambulatory referral to Dentistry  2. Hypertension, unspecified type - CBC with Differential - Comprehensive metabolic panel - TSH - Lipid panel - Increase lisinopril (PRINIVIL,ZESTRIL) 40 MG tablet; Take 1 tablet (40 mg total) by mouth daily.  Dispense: 90 tablet; Refill: 3 - refill metoprolol tartrate (LOPRESSOR) 25 MG tablet; Take 1 tablet (25 mg total) by mouth 2 (two) times daily.  Dispense: 60 tablet; Refill: 11 - refill nitroGLYCERIN (NITROSTAT) 0.4 MG SL tablet; Place 1 tablet (0.4 mg total) under the tongue every 5 (five) minutes as needed for chest pain.  Dispense: 25 tablet; Refill: 3  3. Elevated lipoprotein(a) - atorvastatin (LIPITOR) 40 MG tablet; Take 1 tablet (40 mg total) by mouth daily.  Dispense: 30 tablet; Refill: 11   Meds ordered this encounter  Medications  . acetaminophen-codeine (TYLENOL #3) 300-30 MG tablet    Sig: Take 1 tablet by mouth every 4 (four) hours as needed for up to 7 days for moderate pain.    Dispense:  42 tablet    Refill:  0    Order Specific Question:   Supervising Provider    Answer:   Quentin Angst [id number]  . amoxicillin-clavulanate (AUGMENTIN) 875-125 MG tablet    Sig: Take 1 tablet by mouth 2 (two) times daily.    Dispense:  20 tablet    Refill:  0    Order Specific Question:   Supervising Provider    Answer:   Quentin Angst [id number]  . lisinopril (PRINIVIL,ZESTRIL) 40 MG tablet    Sig: Take 1 tablet (40 mg total) by mouth daily.    Dispense:  90 tablet    Refill:  3    Order Specific Question:   Supervising Provider    Answer:   Quentin Angst [id number]  . metoprolol tartrate (LOPRESSOR) 25 MG tablet    Sig: Take 1 tablet (25 mg total) by mouth 2 (two) times daily.     Dispense:  60 tablet    Refill:  11    Order Specific Question:   Supervising Provider    Answer:   Quentin Angst [id number]  . nitroGLYCERIN (NITROSTAT) 0.4 MG SL tablet    Sig: Place 1 tablet (0.4 mg total) under the tongue every 5 (five) minutes as needed for chest pain.    Dispense:  25 tablet    Refill:  3    Order Specific Question:   Supervising Provider    Answer:   Quentin Angst [id number]  . atorvastatin (LIPITOR) 40 MG tablet    Sig: Take 1 tablet (40 mg total) by mouth daily.    Dispense:  30 tablet    Refill:  11    Please do not dispense  pravastatin RX sent in 3/27  Thank you.    Order Specific Question:   Supervising Provider    Answer:   Quentin Angst [[id number]]    Follow-up: Return in about 6 weeks (around 08/27/2017) for General health maintenance.   Loletta Specter PA

## 2017-07-17 LAB — CBC WITH DIFFERENTIAL/PLATELET
BASOS: 1 %
Basophils Absolute: 0 10*3/uL (ref 0.0–0.2)
EOS (ABSOLUTE): 0 10*3/uL (ref 0.0–0.4)
Eos: 0 %
Hematocrit: 37.5 % (ref 34.0–46.6)
Hemoglobin: 13 g/dL (ref 11.1–15.9)
IMMATURE GRANULOCYTES: 0 %
Immature Grans (Abs): 0 10*3/uL (ref 0.0–0.1)
Lymphocytes Absolute: 2.2 10*3/uL (ref 0.7–3.1)
Lymphs: 50 %
MCH: 30.1 pg (ref 26.6–33.0)
MCHC: 34.7 g/dL (ref 31.5–35.7)
MCV: 87 fL (ref 79–97)
MONOS ABS: 0.2 10*3/uL (ref 0.1–0.9)
Monocytes: 5 %
NEUTROS PCT: 44 %
Neutrophils Absolute: 1.9 10*3/uL (ref 1.4–7.0)
PLATELETS: 341 10*3/uL (ref 150–379)
RBC: 4.32 x10E6/uL (ref 3.77–5.28)
RDW: 14.9 % (ref 12.3–15.4)
WBC: 4.4 10*3/uL (ref 3.4–10.8)

## 2017-07-17 LAB — COMPREHENSIVE METABOLIC PANEL
A/G RATIO: 1.8 (ref 1.2–2.2)
ALT: 14 IU/L (ref 0–32)
AST: 12 IU/L (ref 0–40)
Albumin: 4.8 g/dL (ref 3.6–4.8)
Alkaline Phosphatase: 97 IU/L (ref 39–117)
BILIRUBIN TOTAL: 1.1 mg/dL (ref 0.0–1.2)
BUN/Creatinine Ratio: 10 — ABNORMAL LOW (ref 12–28)
BUN: 7 mg/dL — ABNORMAL LOW (ref 8–27)
CALCIUM: 10 mg/dL (ref 8.7–10.3)
CHLORIDE: 105 mmol/L (ref 96–106)
CO2: 23 mmol/L (ref 20–29)
Creatinine, Ser: 0.67 mg/dL (ref 0.57–1.00)
GFR calc Af Amer: 107 mL/min/{1.73_m2} (ref >59)
GFR, EST NON AFRICAN AMERICAN: 93 mL/min/{1.73_m2} (ref >59)
Globulin, Total: 2.7 g/dL (ref 1.5–4.5)
Glucose: 95 mg/dL (ref 65–99)
POTASSIUM: 4.1 mmol/L (ref 3.5–5.2)
Sodium: 143 mmol/L (ref 134–144)
Total Protein: 7.5 g/dL (ref 6.0–8.5)

## 2017-07-17 LAB — LIPID PANEL
CHOLESTEROL TOTAL: 159 mg/dL (ref 100–199)
Chol/HDL Ratio: 3.1 ratio (ref 0.0–4.4)
HDL: 51 mg/dL (ref >39)
LDL Calculated: 85 mg/dL (ref 0–99)
TRIGLYCERIDES: 117 mg/dL (ref 0–149)
VLDL Cholesterol Cal: 23 mg/dL (ref 5–40)

## 2017-07-17 LAB — TSH: TSH: 1.42 u[IU]/mL (ref 0.450–4.500)

## 2017-07-19 ENCOUNTER — Telehealth (INDEPENDENT_AMBULATORY_CARE_PROVIDER_SITE_OTHER): Payer: Self-pay | Admitting: *Deleted

## 2017-07-19 NOTE — Telephone Encounter (Signed)
Patient verified DOB Patient is aware of labs being normal and to take the antibiotic No further questions at this time.

## 2017-07-19 NOTE — Telephone Encounter (Signed)
-----   Message from Loletta Specteroger David Gomez, PA-C sent at 07/17/2017 12:10 PM EST ----- Results normal.

## 2017-08-12 ENCOUNTER — Encounter: Payer: Self-pay | Admitting: Cardiology

## 2017-08-12 ENCOUNTER — Ambulatory Visit (INDEPENDENT_AMBULATORY_CARE_PROVIDER_SITE_OTHER): Payer: Self-pay | Admitting: Cardiology

## 2017-08-12 VITALS — BP 172/110 | HR 87 | Ht 60.0 in | Wt 153.8 lb

## 2017-08-12 DIAGNOSIS — I2510 Atherosclerotic heart disease of native coronary artery without angina pectoris: Secondary | ICD-10-CM

## 2017-08-12 DIAGNOSIS — I10 Essential (primary) hypertension: Secondary | ICD-10-CM

## 2017-08-12 DIAGNOSIS — I209 Angina pectoris, unspecified: Secondary | ICD-10-CM

## 2017-08-12 DIAGNOSIS — I252 Old myocardial infarction: Secondary | ICD-10-CM

## 2017-08-12 DIAGNOSIS — E7800 Pure hypercholesterolemia, unspecified: Secondary | ICD-10-CM

## 2017-08-12 DIAGNOSIS — I2583 Coronary atherosclerosis due to lipid rich plaque: Secondary | ICD-10-CM

## 2017-08-12 NOTE — Patient Instructions (Signed)

## 2017-08-12 NOTE — Progress Notes (Signed)
Cardiology Office Note    Date:  08/12/2017   ID:  Kim Brady, DOB [birth year], MRN [id number]  PCP:  [name], [name], PA-C  Cardiologist:   Donato SchultzMark Mabrey Howland, MD (Former Myrtis SerKatz)    History of Present Illness:  Kim Brady is a 62 y.o. female former patient of Dr. Myrtis SerKatz with prior non-ST elevation myocardial infarction in March 2016, DES  to mid RCA, doing well. Occasional burning in chest, central short lived, relieved with NTG SL. No SOB. Struggling with smoking. BP high at visits.   Previously she noted some mild dysfunctional uterine bleeding but this seems to have stopped.   Past Medical History:  Diagnosis Date  . Chronic kidney disease   . Ejection fraction   . Essential hypertension Dx March 2016  . NSTEMI (non-ST elevated myocardial infarction) (HCC) 08/10/2014   Inferior, with drug-eluting stent to the RCA    Past Surgical History:  Procedure Laterality Date  . LEFT HEART CATHETERIZATION WITH CORONARY ANGIOGRAM N/A 08/13/2014   Procedure: LEFT HEART CATHETERIZATION WITH CORONARY ANGIOGRAM;  Surgeon: Kathleene Hazelhristopher D McAlhany, MD;  [facility];  Service: Cardiovascular;  Laterality: N/A;    Outpatient Medications Prior to Visit  Medication Sig Dispense Refill  . acetaminophen (TYLENOL) 500 MG tablet Take 1,000 mg by mouth every 6 (six) hours as needed for mild pain.    Marland Kitchen. aspirin 81 MG EC tablet Take 1 tablet (81 mg total) by mouth daily. 30 tablet 11  . atorvastatin (LIPITOR) 40 MG tablet Take 1 tablet (40 mg total) by mouth daily. 30 tablet 11  . bismuth subsalicylate (PEPTO BISMOL) 262 MG/15ML suspension Take 30 mLs by mouth every 6 (six) hours as needed for indigestion.    Marland Kitchen. lisinopril (PRINIVIL,ZESTRIL) 40 MG tablet Take 1 tablet (40 mg total) by mouth daily. 90 tablet 3  . metoprolol tartrate (LOPRESSOR) 25 MG tablet Take 1 tablet (25 mg total) by mouth 2 (two) times daily. 60 tablet 11  . nitroGLYCERIN (NITROSTAT) 0.4 MG SL tablet Place 1 tablet (0.4  mg total) under the tongue every 5 (five) minutes as needed for chest pain. 25 tablet 3  . amoxicillin-clavulanate (AUGMENTIN) 875-125 MG tablet Take 1 tablet by mouth 2 (two) times daily. (Patient not taking: Reported on 08/12/2017) 20 tablet 0   No facility-administered medications prior to visit.      Allergies:   Patient has no known allergies.   Social History   Socioeconomic History  . Marital status: Legally Separated    Spouse name: Not on file  . Number of children: Not on file  . Years of education: Not on file  . Highest education level: Not on file  Occupational History  . Not on file  Social Needs  . Financial resource strain: Not on file  . Food insecurity:    Worry: Not on file    Inability: Not on file  . Transportation needs:    Medical: Not on file    Non-medical: Not on file  Tobacco Use  . Smoking status: Current Every Day Smoker    Packs/day: 0.50    Types: Cigarettes    Last attempt to quit: 08/16/2014    Years since quitting: 2.9  . Smokeless tobacco: Never Used  Substance and Sexual Activity  . Alcohol use: No    Alcohol/week: 0.0 oz  . Drug use: No  . Sexual activity: Yes  Lifestyle  . Physical activity:    Days per week: Not on file  Minutes per session: Not on file  . Stress: Not on file  Relationships  . Social connections:    Talks on phone: Not on file    Gets together: Not on file    Attends religious service: Not on file    Active member of club or organization: Not on file    Attends meetings of clubs or organizations: Not on file    Relationship status: Not on file  Other Topics Concern  . Not on file  Social History Narrative  . Not on file     Family History:  The patient's family history includes Heart attack in her father and mother; Hypertension in her maternal uncle and mother; Lupus in her sister.   ROS:   Please see the history of present illness.    Review of Systems  All other systems reviewed and are  negative.     PHYSICAL EXAM:   VS:  BP (!) 172/110   Pulse 87   Ht 5' (1.524 m)   Wt 153 lb 12.8 oz (69.8 kg)   SpO2 96%   BMI 30.04 kg/m    GEN: Well nourished, well developed, in no acute distress  HEENT: normal  Neck: no JVD, carotid bruits, or masses Cardiac: RRR; no murmurs, rubs, or gallops,no edema  Respiratory:  clear to auscultation bilaterally, normal work of breathing GI: soft, nontender, nondistended, + BS MS: no deformity or atrophy  Skin: warm and dry, no rash Neuro:  Alert and Oriented x 3, Strength and sensation are intact Psych: euthymic mood, full affect   Wt Readings from Last 3 Encounters:  08/12/17 153 lb 12.8 oz (69.8 kg)  07/16/17 151 lb (68.5 kg)  03/29/17 149 lb (67.6 kg)      Studies/Labs Reviewed:   EKG:  08/18/16-sinus rhythm 70 no other significant abnormalities, subtle ST segment change in lead 3. Personally viewed-prior  Sinus rhythm, 64, nonspecific ST-T wave changes, artifact on EKG.  Recent Labs: 07/16/2017: ALT 14; BUN 7; Creatinine, Ser 0.67; Hemoglobin 13.0; Platelets 341; Potassium 4.1; Sodium 143; TSH 1.420   Lipid Panel    Component Value Date/Time   CHOL 159 07/16/2017 1125   TRIG 117 07/16/2017 1125   HDL 51 07/16/2017 1125   CHOLHDL 3.1 07/16/2017 1125   CHOLHDL 4 09/18/2014 0958   VLDL 18.6 09/18/2014 0958   LDLCALC 85 07/16/2017 1125    Additional studies/ records that were reviewed today include:  Office notes reviewed, prior EKGs reviewed, lab work reviewed    ASSESSMENT:    1. Angina pectoris (HCC)   2. Coronary artery disease due to lipid rich plaque   3. History of MI (myocardial infarction)   4. Pure hypercholesterolemia   5. Essential hypertension, benign      PLAN:  In order of problems listed above:  Coronary artery disease -Stable post DES to RCA in March 2016.   Continue aspirin 81 mg. Watch for any signs of dysfunctional uterine bleeding .Doing very well since being off of the Plavix.    Angina  - occasional short pain, NTG resolved. Watch.   History of MI - as above 2016  Hyperlipidemia -Continuing with Pravachol. Dr. Myrtis Ser stated that he was have liked her on a more potent statin but finances were limited. Continue with current medication.  Tobacco use  - trying to quit again. Struggles. We discussed plan.   Essential hypertension  - Pressure prior visit was normal. Is elevated today.  Encouraged home BP monitor.  Medication Adjustments/Labs and Tests Ordered: Current medicines are reviewed at length with the patient today.  Concerns regarding medicines are outlined above.  Medication changes, Labs and Tests ordered today are listed in the Patient Instructions below. Patient Instructions  Medication Instructions:  The current medical regimen is effective;  continue present plan and medications.  Follow-Up: Follow up in 1 year with Dr. Anne Fu.  You will receive a letter in the mail 2 months before you are due.  Please call us when you receive this letter to schedule your follow up appointment.  If you need a refill on your cardiac medications before your next appointment, please call your pharmacy.  Thank you for choosing [facility]!!          Signed, Donato Schultz, MD  08/12/2017 9:08 AM    [facility] [address] [place], [place], [place]  [address] Phone: [phone]; Fax: [phone]

## 2017-08-16 MED FILL — METOPROLOL TARTRATE 25 MG T: 25 | 30 days supply | Qty: 60 | Fill #6

## 2017-08-16 MED FILL — LISINOPRIL 20 MG TAB: 20 | 30 days supply | Qty: 30 | Fill #6

## 2017-08-16 MED FILL — ATORVASTATIN 40 MG TABLET: 40 | 30 days supply | Qty: 30 | Fill #9

## 2017-08-27 ENCOUNTER — Ambulatory Visit (INDEPENDENT_AMBULATORY_CARE_PROVIDER_SITE_OTHER): Payer: Self-pay | Admitting: Physician Assistant

## 2017-08-27 ENCOUNTER — Encounter (INDEPENDENT_AMBULATORY_CARE_PROVIDER_SITE_OTHER): Payer: Self-pay | Admitting: Physician Assistant

## 2017-08-27 ENCOUNTER — Other Ambulatory Visit: Payer: Self-pay

## 2017-08-27 VITALS — BP 177/85 | HR 61 | Temp 98.4°F | Ht 60.0 in | Wt 154.4 lb

## 2017-08-27 DIAGNOSIS — Z124 Encounter for screening for malignant neoplasm of cervix: Secondary | ICD-10-CM

## 2017-08-27 DIAGNOSIS — Z1159 Encounter for screening for other viral diseases: Secondary | ICD-10-CM

## 2017-08-27 DIAGNOSIS — I10 Essential (primary) hypertension: Secondary | ICD-10-CM

## 2017-08-27 DIAGNOSIS — Z1239 Encounter for other screening for malignant neoplasm of breast: Secondary | ICD-10-CM

## 2017-08-27 DIAGNOSIS — Z1231 Encounter for screening mammogram for malignant neoplasm of breast: Secondary | ICD-10-CM

## 2017-08-27 DIAGNOSIS — Z23 Encounter for immunization: Secondary | ICD-10-CM

## 2017-08-27 DIAGNOSIS — Z1211 Encounter for screening for malignant neoplasm of colon: Secondary | ICD-10-CM

## 2017-08-27 MED ORDER — AMLODIPINE BESYLATE 5 MG PO TABS: 5.0000 mg | ORAL_TABLET | Freq: Every day | ORAL | 2 refills | Status: DC

## 2017-08-27 MED FILL — AMLODIPINE BESYLATE 5 MG TA: 5 | 30 days supply | Qty: 30 | Fill #0

## 2017-08-27 NOTE — Patient Instructions (Signed)
DASH Eating Plan DASH stands for "Dietary Approaches to Stop Hypertension." The DASH eating plan is a healthy eating plan that has been shown to reduce high blood pressure (hypertension). It may also reduce your risk for type 2 diabetes, heart disease, and stroke. The DASH eating plan may also help with weight loss. What are tips for following this plan? General guidelines  Avoid eating more than 2,300 mg (milligrams) of salt (sodium) a day. If you have hypertension, you may need to reduce your sodium intake to 1,500 mg a day.  Limit alcohol intake to no more than 1 drink a day for nonpregnant women and 2 drinks a day for men. One drink equals 12 oz of beer, 5 oz of wine, or 1 oz of hard liquor.  Work with your health care provider to maintain a healthy body weight or to lose weight. Ask what an ideal weight is for you.  Get at least 30 minutes of exercise that causes your heart to beat faster (aerobic exercise) most days of the week. Activities may include walking, swimming, or biking.  Work with your health care provider or diet and nutrition specialist (dietitian) to adjust your eating plan to your individual calorie needs. Reading food labels  Check food labels for the amount of sodium per serving. Choose foods with less than 5 percent of the Daily Value of sodium. Generally, foods with less than 300 mg of sodium per serving fit into this eating plan.  To find whole grains, look for the word "whole" as the first word in the ingredient list. Shopping  Buy products labeled as "low-sodium" or "no salt added."  Buy fresh foods. Avoid canned foods and premade or frozen meals. Cooking  Avoid adding salt when cooking. Use salt-free seasonings or herbs instead of table salt or sea salt. Check with your health care provider or pharmacist before using salt substitutes.  Do not fry foods. Cook foods using healthy methods such as baking, boiling, grilling, and broiling instead.  Cook with  heart-healthy oils, such as olive, canola, soybean, or sunflower oil. Meal planning   Eat a balanced diet that includes: ? 5 or more servings of fruits and vegetables each day. At each meal, try to fill half of your plate with fruits and vegetables. ? Up to 6-8 servings of whole grains each day. ? Less than 6 oz of lean meat, poultry, or fish each day. A 3-oz serving of meat is about the same size as a deck of cards. One egg equals 1 oz. ? 2 servings of low-fat dairy each day. ? A serving of nuts, seeds, or beans 5 times each week. ? Heart-healthy fats. Healthy fats called Omega-3 fatty acids are found in foods such as flaxseeds and coldwater fish, like sardines, salmon, and mackerel.  Limit how much you eat of the following: ? Canned or prepackaged foods. ? Food that is high in trans fat, such as fried foods. ? Food that is high in saturated fat, such as fatty meat. ? Sweets, desserts, sugary drinks, and other foods with added sugar. ? Full-fat dairy products.  Do not salt foods before eating.  Try to eat at least 2 vegetarian meals each week.  Eat more home-cooked food and less restaurant, buffet, and fast food.  When eating at a restaurant, ask that your food be prepared with less salt or no salt, if possible. What foods are recommended? The items listed may not be a complete list. Talk with your dietitian about what   dietary choices are best for you. Grains Whole-grain or whole-wheat bread. Whole-grain or whole-wheat pasta. Brown rice. Oatmeal. Quinoa. Bulgur. Whole-grain and low-sodium cereals. Pita bread. Low-fat, low-sodium crackers. Whole-wheat flour tortillas. Vegetables Fresh or frozen vegetables (raw, steamed, roasted, or grilled). Low-sodium or reduced-sodium tomato and vegetable juice. Low-sodium or reduced-sodium tomato sauce and tomato paste. Low-sodium or reduced-sodium canned vegetables. Fruits All fresh, dried, or frozen fruit. Canned fruit in natural juice (without  added sugar). Meat and other protein foods Skinless chicken or turkey. Ground chicken or turkey. Pork with fat trimmed off. Fish and seafood. Egg whites. Dried beans, peas, or lentils. Unsalted nuts, nut butters, and seeds. Unsalted canned beans. Lean cuts of beef with fat trimmed off. Low-sodium, lean deli meat. Dairy Low-fat (1%) or fat-free (skim) milk. Fat-free, low-fat, or reduced-fat cheeses. Nonfat, low-sodium ricotta or cottage cheese. Low-fat or nonfat yogurt. Low-fat, low-sodium cheese. Fats and oils Soft margarine without trans fats. Vegetable oil. Low-fat, reduced-fat, or light mayonnaise and salad dressings (reduced-sodium). Canola, safflower, olive, soybean, and sunflower oils. Avocado. Seasoning and other foods Herbs. Spices. Seasoning mixes without salt. Unsalted popcorn and pretzels. Fat-free sweets. What foods are not recommended? The items listed may not be a complete list. Talk with your dietitian about what dietary choices are best for you. Grains Baked goods made with fat, such as croissants, muffins, or some breads. Dry pasta or rice meal packs. Vegetables Creamed or fried vegetables. Vegetables in a cheese sauce. Regular canned vegetables (not low-sodium or reduced-sodium). Regular canned tomato sauce and paste (not low-sodium or reduced-sodium). Regular tomato and vegetable juice (not low-sodium or reduced-sodium). Pickles. Olives. Fruits Canned fruit in a light or heavy syrup. Fried fruit. Fruit in cream or butter sauce. Meat and other protein foods Fatty cuts of meat. Ribs. Fried meat. Bacon. Sausage. Bologna and other processed lunch meats. Salami. Fatback. Hotdogs. Bratwurst. Salted nuts and seeds. Canned beans with added salt. Canned or smoked fish. Whole eggs or egg yolks. Chicken or turkey with skin. Dairy Whole or 2% milk, cream, and half-and-half. Whole or full-fat cream cheese. Whole-fat or sweetened yogurt. Full-fat cheese. Nondairy creamers. Whipped toppings.  Processed cheese and cheese spreads. Fats and oils Butter. Stick margarine. Lard. Shortening. Ghee. Bacon fat. Tropical oils, such as coconut, palm kernel, or palm oil. Seasoning and other foods Salted popcorn and pretzels. Onion salt, garlic salt, seasoned salt, table salt, and sea salt. Worcestershire sauce. Tartar sauce. Barbecue sauce. Teriyaki sauce. Soy sauce, including reduced-sodium. Steak sauce. Canned and packaged gravies. Fish sauce. Oyster sauce. Cocktail sauce. Horseradish that you find on the shelf. Ketchup. Mustard. Meat flavorings and tenderizers. Bouillon cubes. Hot sauce and Tabasco sauce. Premade or packaged marinades. Premade or packaged taco seasonings. Relishes. Regular salad dressings. Where to find more information:  National Heart, Lung, and Blood Institute: www.nhlbi.nih.gov  American Heart Association: www.heart.org Summary  The DASH eating plan is a healthy eating plan that has been shown to reduce high blood pressure (hypertension). It may also reduce your risk for type 2 diabetes, heart disease, and stroke.  With the DASH eating plan, you should limit salt (sodium) intake to 2,300 mg a day. If you have hypertension, you may need to reduce your sodium intake to 1,500 mg a day.  When on the DASH eating plan, aim to eat more fresh fruits and vegetables, whole grains, lean proteins, low-fat dairy, and heart-healthy fats.  Work with your health care provider or diet and nutrition specialist (dietitian) to adjust your eating plan to your individual   calorie needs. This information is not intended to replace advice given to you by your health care provider. Make sure you discuss any questions you have with your health care provider. Document Released: 04/30/2011 Document Revised: 05/04/2016 Document Reviewed: 05/04/2016 Elsevier Interactive Patient Education  2018 Else[employer].  Managing Your Hypertension Hypertension is commonly called high blood pressure. This is when  the force of your blood pressing against the walls of your arteries is too strong. Arteries are blood vessels that carry blood from your heart throughout your body. Hypertension forces the heart to work harder to pump blood, and may cause the arteries to become narrow or stiff. Having untreated or uncontrolled hypertension can cause heart attack, stroke, kidney disease, and other problems. What are blood pressure readings? A blood pressure reading consists of a higher number over a lower number. Ideally, your blood pressure should be below 120/80. The first ("top") number is called the systolic pressure. It is a measure of the pressure in your arteries as your heart beats. The second ("bottom") number is called the diastolic pressure. It is a measure of the pressure in your arteries as the heart relaxes. What does my blood pressure reading mean? Blood pressure is classified into four stages. Based on your blood pressure reading, your health care provider may use the following stages to determine what type of treatment you need, if any. Systolic pressure and diastolic pressure are measured in a unit called mm Hg. Normal  Systolic pressure: below 120.  Diastolic pressure: below 80. Elevated  Systolic pressure: 120-129.  Diastolic pressure: below 80. Hypertension stage 1  Systolic pressure: 130-139.  Diastolic pressure: 80-89. Hypertension stage 2  Systolic pressure: 140 or above.  Diastolic pressure: 90 or above. What health risks are associated with hypertension? Managing your hypertension is an important responsibility. Uncontrolled hypertension can lead to:  A heart attack.  A stroke.  A weakened blood vessel (aneurysm).  Heart failure.  Kidney damage.  Eye damage.  Metabolic syndrome.  Memory and concentration problems.  What changes can I make to manage my hypertension? Hypertension can be managed by making lifestyle changes and possibly by taking medicines. Your  health care provider will help you make a plan to bring your blood pressure within a normal range. Eating and drinking  Eat a diet that is high in fiber and potassium, and low in salt (sodium), added sugar, and fat. An example eating plan is called the DASH (Dietary Approaches to Stop Hypertension) diet. To eat this way: ? Eat plenty of fresh fruits and vegetables. Try to fill half of your plate at each meal with fruits and vegetables. ? Eat whole grains, such as whole wheat pasta, brown rice, or whole grain bread. Fill about one quarter of your plate with whole grains. ? Eat low-fat diary products. ? Avoid fatty cuts of meat, processed or cured meats, and poultry with skin. Fill about one quarter of your plate with lean proteins such as fish, chicken without skin, beans, eggs, and tofu. ? Avoid premade and processed foods. These tend to be higher in sodium, added sugar, and fat.  Reduce your daily sodium intake. Most people with hypertension should eat less than 1,500 mg of sodium a day.  Limit alcohol intake to no more than 1 drink a day for nonpregnant women and 2 drinks a day for men. One drink equals 12 oz of beer, 5 oz of wine, or 1 oz of hard liquor. Lifestyle  Work with your health care provider   to maintain a healthy body weight, or to lose weight. Ask what an ideal weight is for you.  Get at least 30 minutes of exercise that causes your heart to beat faster (aerobic exercise) most days of the week. Activities may include walking, swimming, or biking.  Include exercise to strengthen your muscles (resistance exercise), such as weight lifting, as part of your weekly exercise routine. Try to do these types of exercises for 30 minutes at least 3 days a week.  Do not use any products that contain nicotine or tobacco, such as cigarettes and e-cigarettes. If you need help quitting, ask your health care provider.  Control any long-term (chronic) conditions you have, such as high cholesterol  or diabetes. Monitoring  Monitor your blood pressure at home as told by your health care provider. Your personal target blood pressure may vary depending on your medical conditions, your age, and other factors.  Have your blood pressure checked regularly, as often as told by your health care provider. Working with your health care provider  Review all the medicines you take with your health care provider because there may be side effects or interactions.  Talk with your health care provider about your diet, exercise habits, and other lifestyle factors that may be contributing to hypertension.  Visit your health care provider regularly. Your health care provider can help you create and adjust your plan for managing hypertension. Will I need medicine to control my blood pressure? Your health care provider may prescribe medicine if lifestyle changes are not enough to get your blood pressure under control, and if:  Your systolic blood pressure is 130 or higher.  Your diastolic blood pressure is 80 or higher.  Take medicines only as told by your health care provider. Follow the directions carefully. Blood pressure medicines must be taken as prescribed. The medicine does not work as well when you skip doses. Skipping doses also puts you at risk for problems. Contact a health care provider if:  You think you are having a reaction to medicines you have taken.  You have repeated (recurrent) headaches.  You feel dizzy.  You have swelling in your ankles.  You have trouble with your vision. Get help right away if:  You develop a severe headache or confusion.  You have unusual weakness or numbness, or you feel faint.  You have severe pain in your chest or abdomen.  You vomit repeatedly.  You have trouble breathing. Summary  Hypertension is when the force of blood pumping through your arteries is too strong. If this condition is not controlled, it may put you at risk for serious  complications.  Your personal target blood pressure may vary depending on your medical conditions, your age, and other factors. For most people, a normal blood pressure is less than 120/80.  Hypertension is managed by lifestyle changes, medicines, or both. Lifestyle changes include weight loss, eating a healthy, low-sodium diet, exercising more, and limiting alcohol. This information is not intended to replace advice given to you by your health care provider. Make sure you discuss any questions you have with your health care provider. Document Released: 02/03/2012 Document Revised: 04/08/2016 Document Reviewed: 04/08/2016 Elsevier Interactive Patient Education  2018 Elsevier Inc.  

## 2017-08-27 NOTE — Progress Notes (Signed)
Subjective:  Patient ID: Kim Brady, female    DOB: 04-05-1956  Age: 62 y.o. MRN: [id number]  CC: annual exam  HPI Kim Brady is a 62 y.o. female with a medical history of CAD, HLD, HTN, MI, tobacco abuse, and dental abscess presents for general health maintenance. Feeling well. No complaints or symptoms.   BP 165/84 mmHg six weeks ago. Had lisinopril increased to 40 mg and metoprolol 25 mg BID refilled. BP noted to be 177/85 mmHg but patient attributes this to white coat HTN. Taking medications as directed. Recent cardiology visit saw a BP of 172/110 mmHg but advised pt to monitor BP at home. Pt does not have a BP monitor at home. Cardiologist reported they had previously seen a normal blood pressure in their office (08/14/2015). Does not endorse CP, palpitations, SOB, HA, abdominal pain, f/c/n/v, presyncope, syncope, weakness, tingling, numbness, rash, or GI/GU sxs.   Outpatient Medications Prior to Visit  Medication Sig Dispense Refill  . acetaminophen (TYLENOL) 500 MG tablet Take 1,000 mg by mouth every 6 (six) hours as needed for mild pain.    Marland Kitchen aspirin 81 MG EC tablet Take 1 tablet (81 mg total) by mouth daily. 30 tablet 11  . atorvastatin (LIPITOR) 40 MG tablet Take 1 tablet (40 mg total) by mouth daily. 30 tablet 11  . bismuth subsalicylate (PEPTO BISMOL) 262 MG/15ML suspension Take 30 mLs by mouth every 6 (six) hours as needed for indigestion.    Marland Kitchen lisinopril (PRINIVIL,ZESTRIL) 40 MG tablet Take 1 tablet (40 mg total) by mouth daily. 90 tablet 3  . metoprolol tartrate (LOPRESSOR) 25 MG tablet Take 1 tablet (25 mg total) by mouth 2 (two) times daily. 60 tablet 11  . nitroGLYCERIN (NITROSTAT) 0.4 MG SL tablet Place 1 tablet (0.4 mg total) under the tongue every 5 (five) minutes as needed for chest pain. 25 tablet 3   No facility-administered medications prior to visit.      ROS Review of Systems  Constitutional: Negative for chills, fever and malaise/fatigue.  Eyes:  Negative for blurred vision.  Respiratory: Negative for shortness of breath.   Cardiovascular: Negative for chest pain and palpitations.  Gastrointestinal: Negative for abdominal pain and nausea.  Genitourinary: Negative for dysuria and hematuria.  Musculoskeletal: Negative for joint pain and myalgias.  Skin: Negative for rash.  Neurological: Negative for tingling and headaches.  Psychiatric/Behavioral: Negative for depression. The patient is not nervous/anxious.     Objective:  BP (!) 177/85 (BP Location: Left Arm, Patient Position: Sitting, Cuff Size: Normal)   Pulse 61   Temp 98.4 F (36.9 C) (Oral)   Ht 5' (1.524 m)   Wt 154 lb 6.4 oz (70 kg)   SpO2 97%   BMI 30.15 kg/m   BP/Weight 08/27/2017 08/12/2017 07/16/2017  Systolic BP 177 172 165  Diastolic BP 85 110 84  Wt. (Lbs) 154.4 153.8 151  BMI 30.15 30.04 29.49      Physical Exam  Constitutional: She is oriented to person, place, and time.  Well developed, well nourished, NAD, polite  HENT:  Head: Normocephalic and atraumatic.  Eyes: Conjunctivae are normal. No scleral icterus.  Neck: Normal range of motion. Neck supple. No thyromegaly present.  Cardiovascular: Normal rate, regular rhythm and normal heart sounds.  No LE edema bilaterally. No carotid bruit bilaterally.  Pulmonary/Chest: Effort normal and breath sounds normal. No respiratory distress. She has no wheezes.  Abdominal: Soft. Bowel sounds are normal. There is no tenderness.  Musculoskeletal: She exhibits  no edema.  Neurological: She is alert and oriented to person, place, and time. No cranial nerve deficit. Coordination normal.  Skin: Skin is warm and dry. No rash noted. No erythema. No pallor.  Psychiatric: She has a normal mood and affect. Her behavior is normal. Thought content normal.  Vitals reviewed.    Assessment & Plan:   1. Hypertension, unspecified type - Begin amLODipine (NORVASC) 5 MG tablet; Take 1 tablet (5 mg total) by mouth daily.   Dispense: 30 tablet; Refill: 2 - Continue Metoprolol and lisinopril - Ambulatory referral to Cardiology for 24 hr ambulatory blood pressure monitor  2. Need for hepatitis C screening test - Hepatitis c antibody (reflex)  3. Special screening for malignant neoplasms, colon - Fecal occult blood, imunochemical  4. Screening for breast cancer - MS DIGITAL SCREENING BILATERAL; Future  5. Screening for cervical cancer - Ambulatory referral to Gynecology  6. Need for Tdap vaccination - Tdap vaccine greater than or equal to 7yo IM; Future   Meds ordered this encounter  Medications  . amLODipine (NORVASC) 5 MG tablet    Sig: Take 1 tablet (5 mg total) by mouth daily.    Dispense:  30 tablet    Refill:  2    Order Specific Question:   Supervising Provider    Answer:   Quentin AngstJEGEDE, OLUGBEMIGA E [id number]    Follow-up: Return in about 6 weeks (around 10/08/2017) for HTN.   Loletta Specteroger David Cathleen Yagi PA

## 2017-08-28 LAB — HCV COMMENT:

## 2017-08-28 LAB — HEPATITIS C ANTIBODY (REFLEX)

## 2017-08-30 ENCOUNTER — Telehealth (INDEPENDENT_AMBULATORY_CARE_PROVIDER_SITE_OTHER): Payer: Self-pay

## 2017-08-30 NOTE — Telephone Encounter (Signed)
Patient is aware of negative Hepatitis C. Kim Brady, CMA

## 2017-08-30 NOTE — Telephone Encounter (Signed)
-----   Message from Loletta Specteroger David Gomez, PA-C sent at 08/30/2017  8:39 AM EDT ----- HCV negative.

## 2017-09-01 LAB — FECAL OCCULT BLOOD, IMMUNOCHEMICAL: FECAL OCCULT BLD: NEGATIVE

## 2017-09-03 ENCOUNTER — Telehealth (INDEPENDENT_AMBULATORY_CARE_PROVIDER_SITE_OTHER): Payer: Self-pay

## 2017-09-03 NOTE — Telephone Encounter (Signed)
Left message asking patient to return call to [facility]. Tempestt S Roberts, CMA  

## 2017-09-03 NOTE — Telephone Encounter (Signed)
Patient returned call to the office and was provided with negative HCV and FIT results. Maryjean Mornempestt S Roberts, CMA

## 2017-09-03 NOTE — Telephone Encounter (Signed)
-----   Message from [name] Gomez, PA-C sent at 09/01/2017  6:37 PM EDT ----- Negative FIT. 

## 2017-09-03 NOTE — Telephone Encounter (Signed)
-----   Message from Loletta Specteroger David Gomez, PA-C sent at 09/01/2017  6:37 PM EDT ----- Negative FIT.

## 2017-09-20 MED FILL — AMLODIPINE BESYLATE 5 MG TA: 5 | 30 days supply | Qty: 30 | Fill #1

## 2017-09-20 MED FILL — ATORVASTATIN 40 MG TABLET: 40 | 30 days supply | Qty: 30 | Fill #0

## 2017-09-20 MED FILL — METOPROLOL TARTRATE 25 MG T: 25 | 30 days supply | Qty: 60 | Fill #7

## 2017-09-20 MED FILL — LISINOPRIL 20 MG TAB: 20 | 30 days supply | Qty: 30 | Fill #7

## 2017-10-07 ENCOUNTER — Encounter (INDEPENDENT_AMBULATORY_CARE_PROVIDER_SITE_OTHER): Payer: Self-pay | Admitting: Nurse Practitioner

## 2017-10-07 ENCOUNTER — Other Ambulatory Visit: Payer: Self-pay

## 2017-10-07 ENCOUNTER — Ambulatory Visit (INDEPENDENT_AMBULATORY_CARE_PROVIDER_SITE_OTHER): Payer: Self-pay | Admitting: Nurse Practitioner

## 2017-10-07 VITALS — BP 127/81 | HR 63 | Temp 97.6°F | Ht 60.0 in | Wt 153.2 lb

## 2017-10-07 DIAGNOSIS — I10 Essential (primary) hypertension: Secondary | ICD-10-CM

## 2017-10-07 DIAGNOSIS — K219 Gastro-esophageal reflux disease without esophagitis: Secondary | ICD-10-CM

## 2017-10-07 MED ORDER — RANITIDINE HCL 150 MG PO TABS: 150.0000 mg | ORAL_TABLET | Freq: Two times a day (BID) | ORAL | 2 refills | Status: DC

## 2017-10-07 MED FILL — raNITIdine HCL 150 MG TABS: 150 | 30 days supply | Qty: 60 | Fill #0

## 2017-10-07 NOTE — Patient Instructions (Signed)
Bland Diet A bland diet consists of foods that do not have a lot of fat or fiber. Foods without fat or fiber are easier for the body to digest. They are also less likely to irritate your mouth, throat, stomach, and other parts of your gastrointestinal tract. A bland diet is sometimes called a BRAT diet. What is my plan? Your health care provider or dietitian may recommend specific changes to your diet to prevent and treat your symptoms, such as:  Eating small meals often.  Cooking food until it is soft enough to chew easily.  Chewing your food well.  Drinking fluids slowly.  Not eating foods that are very spicy, sour, or fatty.  Not eating citrus fruits, such as oranges and grapefruit.  What do I need to know about this diet?  Eat a variety of foods from the bland diet food list.  Do not follow a bland diet longer than you have to.  Ask your health care provider whether you should take vitamins. What foods can I eat? Grains  Hot cereals, such as cream of wheat. Bread, crackers, or tortillas made from refined white flour. Rice. Vegetables Canned or cooked vegetables. Mashed or boiled potatoes. Fruits Bananas. Applesauce. Other types of cooked or canned fruit with the skin and seeds removed, such as canned peaches or pears. Meats and Other Protein Sources Scrambled eggs. Creamy peanut butter or other nut butters. Lean, well-cooked meats, such as chicken or fish. Tofu. Soups or broths. Dairy Low-fat dairy products, such as milk, cottage cheese, or yogurt. Beverages Water. Herbal tea. Apple juice. Sweets and Desserts Pudding. Custard. Fruit gelatin. Ice cream. Fats and Oils Mild salad dressings. Canola or olive oil. The items listed above may not be a complete list of allowed foods or beverages. Contact your dietitian for more options. What foods are not recommended? Foods and ingredients that are often not recommended include:  Spicy foods, such as hot sauce or  salsa.  Fried foods.  Sour foods, such as pickled or fermented foods.  Raw vegetables or fruits, especially citrus or berries.  Caffeinated drinks.  Alcohol.  Strongly flavored seasonings or condiments.  The items listed above may not be a complete list of foods and beverages that are not allowed. Contact your dietitian for more information. This information is not intended to replace advice given to you by your health care provider. Make sure you discuss any questions you have with your health care provider. Document Released: 09/02/2015 Document Revised: 10/17/2015 Document Reviewed: 05/23/2014 Elsevier Interactive Patient Education  2018 Elsevier Inc.  Gastroesophageal Reflux Disease, Adult Normally, food travels down the esophagus and stays in the stomach to be digested. If a person has gastroesophageal reflux disease (GERD), food and stomach acid move back up into the esophagus. When this happens, the esophagus becomes sore and swollen (inflamed). Over time, GERD can make small holes (ulcers) in the lining of the esophagus. Follow these instructions at home: Diet  Follow a diet as told by your doctor. You may need to avoid foods and drinks such as: ? Coffee and tea (with or without caffeine). ? Drinks that contain alcohol. ? Energy drinks and sports drinks. ? Carbonated drinks or sodas. ? Chocolate and cocoa. ? Peppermint and mint flavorings. ? Garlic and onions. ? Horseradish. ? Spicy and acidic foods, such as peppers, chili powder, curry powder, vinegar, hot sauces, and BBQ sauce. ? Citrus fruit juices and citrus fruits, such as oranges, lemons, and limes. ? Tomato-based foods, such as red  sauce, chili, salsa, and pizza with red sauce. ? Fried and fatty foods, such as donuts, french fries, potato chips, and high-fat dressings. ? High-fat meats, such as hot dogs, rib eye steak, sausage, ham, and bacon. ? High-fat dairy items, such as whole milk, butter, and cream  cheese.  Eat small meals often. Avoid eating large meals.  Avoid drinking large amounts of liquid with your meals.  Avoid eating meals during the 2-3 hours before bedtime.  Avoid lying down right after you eat.  Do not exercise right after you eat. General instructions  Pay attention to any changes in your symptoms.  Take over-the-counter and prescription medicines only as told by your doctor. Do not take aspirin, ibuprofen, or other NSAIDs unless your doctor says it is okay.  Do not use any tobacco products, including cigarettes, chewing tobacco, and e-cigarettes. If you need help quitting, ask your doctor.  Wear loose clothes. Do not wear anything tight around your waist.  Raise (elevate) the head of your bed about 6 inches (15 cm).  Try to lower your stress. If you need help doing this, ask your doctor.  If you are overweight, lose an amount of weight that is healthy for you. Ask your doctor about a safe weight loss goal.  Keep all follow-up visits as told by your doctor. This is important. Contact a doctor if:  You have new symptoms.  You lose weight and you do not know why it is happening.  You have trouble swallowing, or it hurts to swallow.  You have wheezing or a cough that keeps happening.  Your symptoms do not get better with treatment.  You have a hoarse voice. Get help right away if:  You have pain in your arms, neck, jaw, teeth, or back.  You feel sweaty, dizzy, or light-headed.  You have chest pain or shortness of breath.  You throw up (vomit) and your throw up looks like blood or coffee grounds.  You pass out (faint).  Your poop (stool) is bloody or black.  You cannot swallow, drink, or eat. This information is not intended to replace advice given to you by your health care provider. Make sure you discuss any questions you have with your health care provider. Document Released: 10/28/2007 Document Revised: 10/17/2015 Document Reviewed:  09/05/2014 Elsevier Interactive Patient Education  [employer].

## 2017-10-07 NOTE — Progress Notes (Signed)
Assessment & Plan:  Kim Brady was seen today for follow-up.  Diagnoses and all orders for this visit:  Essential hypertension, benign Continue all antihypertensives as prescribed.  Remember to bring in your blood pressure log with you for your follow up appointment.  DASH/Mediterranean Diets are healthier choices for HTN.    Gastroesophageal reflux disease, esophagitis presence not specified -     ranitidine (ZANTAC) 150 MG tablet; Take 1 tablet (150 mg total) by mouth 2 (two) times daily.   Patient has been counseled on age-appropriate routine health concerns for screening and prevention. These are reviewed and up-to-date. Referrals have been placed accordingly. Immunizations are up-to-date or declined.    Subjective:   Chief Complaint  Patient presents with  . Follow-up    HTN   HPI Kim Brady 62 y.o. female presents to office today for f/u HTN and she also has complaints of poorly controlled GERD today.    Essential Hypertension Chronic. Stable. Norvasc  was added to her blood pressure regimen at her last office visit on 08-27-2017. She endorses medication compliance and continues to take metoprolol 25 mg twice daily and lisinopril 40 mg daily. Denies chest pain, shortness of breath, palpitations, lightheadedness, dizziness, headaches or BLE edema.  BP Readings from Last 3 Encounters:  10/07/17 127/81  08/27/17 (!) 177/85  08/12/17 (!) 172/110     GERD Paitent complains of heartburn. This has been associated with bilious reflux and heartburn and nocturnal burning.  She denies choking on food, deep pressure at base of neck, difficulty swallowing, dysphagia, hematemesis and regurgitation of undigested food. Symptoms have been present for several months. She denies dysphagia.  She has not lost weight. She denies melena, hematochezia, hematemesis, and coffee ground emesis. Medical therapy in the past has included pepto bismol and alka seltzer.  Review of Systems   Constitutional: Negative for fever, malaise/fatigue and weight loss.  HENT: Negative.  Negative for nosebleeds.   Eyes: Negative.  Negative for blurred vision, double vision and photophobia.  Respiratory: Negative.  Negative for cough and shortness of breath.   Cardiovascular: Negative.  Negative for chest pain, palpitations and leg swelling.  Gastrointestinal: Positive for heartburn. Negative for nausea and vomiting.  Musculoskeletal: Negative.  Negative for myalgias.  Neurological: Negative.  Negative for dizziness, focal weakness, seizures and headaches.  Psychiatric/Behavioral: Negative.  Negative for suicidal ideas.    Past Medical History:  Diagnosis Date  . Chronic kidney disease   . Ejection fraction   . Essential hypertension Dx March 2016  . NSTEMI (non-ST elevated myocardial infarction) (HCC) 08/10/2014   Inferior, with drug-eluting stent to the RCA    Past Surgical History:  Procedure Laterality Date  . LEFT HEART CATHETERIZATION WITH CORONARY ANGIOGRAM N/A 08/13/2014   Procedure: LEFT HEART CATHETERIZATION WITH CORONARY ANGIOGRAM;  Surgeon: Kathleene Hazel, MD;  Location: [facility] CATH LAB;  Service: Cardiovascular;  Laterality: N/A;    Family History  Problem Relation Age of Onset  . Heart attack Mother   . Hypertension Mother   . Heart attack Father   . Lupus Sister   . Hypertension Maternal Uncle   . Stroke Neg Hx     Social History Reviewed with no changes to be made today.   Outpatient Medications Prior to Visit  Medication Sig Dispense Refill  . acetaminophen (TYLENOL) 500 MG tablet Take 1,000 mg by mouth every 6 (six) hours as needed for mild pain.    Marland Kitchen amLODipine (NORVASC) 5 MG tablet Take 1 tablet (5  mg total) by mouth daily. 30 tablet 2  . aspirin 81 MG EC tablet Take 1 tablet (81 mg total) by mouth daily. 30 tablet 11  . atorvastatin (LIPITOR) 40 MG tablet Take 1 tablet (40 mg total) by mouth daily. 30 tablet 11  . bismuth subsalicylate (PEPTO  BISMOL) 262 MG/15ML suspension Take 30 mLs by mouth every 6 (six) hours as needed for indigestion.    Marland Kitchen lisinopril (PRINIVIL,ZESTRIL) 40 MG tablet Take 1 tablet (40 mg total) by mouth daily. 90 tablet 3  . metoprolol tartrate (LOPRESSOR) 25 MG tablet Take 1 tablet (25 mg total) by mouth 2 (two) times daily. 60 tablet 11  . nitroGLYCERIN (NITROSTAT) 0.4 MG SL tablet Place 1 tablet (0.4 mg total) under the tongue every 5 (five) minutes as needed for chest pain. 25 tablet 3   No facility-administered medications prior to visit.     No Known Allergies     Objective:    BP 127/81 (BP Location: Left Arm, Patient Position: Sitting, Cuff Size: Normal)   Pulse 63   Temp 97.6 F (36.4 C) (Oral)   Ht 5' (1.524 m)   Wt 153 lb 3.2 oz (69.5 kg)   SpO2 96%   BMI 29.92 kg/m  Wt Readings from Last 3 Encounters:  10/07/17 153 lb 3.2 oz (69.5 kg)  08/27/17 154 lb 6.4 oz (70 kg)  08/12/17 153 lb 12.8 oz (69.8 kg)    Physical Exam  Constitutional: She is oriented to person, place, and time. She appears well-developed and well-nourished. She is cooperative.  HENT:  Head: Normocephalic and atraumatic.  Eyes: EOM are normal.  Neck: Normal range of motion. No thyromegaly present.  Cardiovascular: Normal rate, regular rhythm and normal heart sounds. Exam reveals no gallop and no friction rub.  No murmur heard. Pulmonary/Chest: Effort normal and breath sounds normal. No tachypnea. No respiratory distress. She has no decreased breath sounds. She has no wheezes. She has no rhonchi. She has no rales. She exhibits no tenderness.  Abdominal: Bowel sounds are normal.  Musculoskeletal: Normal range of motion. She exhibits no edema.  Neurological: She is alert and oriented to person, place, and time. Coordination normal.  Skin: Skin is warm and dry.  Psychiatric: She has a normal mood and affect. Her behavior is normal. Judgment and thought content normal.  Nursing note and vitals reviewed.      Patient  has been counseled extensively about nutrition and exercise as well as the importance of adherence with medications and regular follow-up. The patient was given clear instructions to go to ER or return to medical center if symptoms don't improve, worsen or new problems develop. The patient verbalized understanding.   Follow-up: Return in about 3 months (around 01/07/2018) for HTN/GERD.   Claiborne Rigg, FNP-BC [facility] [place], [place] [phone]   10/07/2017, 1:10 PM

## 2017-10-29 ENCOUNTER — Ambulatory Visit: Payer: Self-pay | Admitting: Cardiology

## 2017-11-01 MED FILL — ?AMLODIPINE BESYLATE 5 MG T: 5 MG | 30 days supply | Qty: 30 | Fill #2

## 2017-11-01 MED FILL — raNITIdine HCL 150 MG TABS: 150 | 30 days supply | Qty: 60 | Fill #1

## 2017-11-01 MED FILL — ?ATORVASTATIN 40MG TABLET: 40 | 30 days supply | Qty: 30 | Fill #1

## 2017-11-01 MED FILL — LISINOPRIL 20 MG TAB: 20 | 30 days supply | Qty: 30 | Fill #8

## 2017-11-01 MED FILL — ?METOPROLOL 25 MG TABLET: 25 | 30 days supply | Qty: 60 | Fill #8

## 2017-12-07 ENCOUNTER — Other Ambulatory Visit: Payer: Self-pay | Admitting: Cardiology

## 2017-12-07 DIAGNOSIS — I10 Essential (primary) hypertension: Secondary | ICD-10-CM

## 2017-12-07 MED ORDER — LISINOPRIL 40 MG PO TABS: 40.0000 mg | ORAL_TABLET | Freq: Every day | ORAL | 2 refills | Status: DC

## 2017-12-07 MED FILL — METOPROLOL TARTRATE 25 MG T: 25 | 30 days supply | Qty: 60 | Fill #0

## 2017-12-07 MED FILL — LISINOPRIL 40 MG TABLET: 40 | 30 days supply | Qty: 30 | Fill #0

## 2017-12-07 MED FILL — ?ATORVASTATIN 40MG TABLET: 40 | 30 days supply | Qty: 30 | Fill #2

## 2017-12-07 MED FILL — raNITIdine HCL 150 MG TABS: 150 | 30 days supply | Qty: 60 | Fill #2

## 2017-12-09 ENCOUNTER — Telehealth (INDEPENDENT_AMBULATORY_CARE_PROVIDER_SITE_OTHER): Payer: Self-pay | Admitting: Physician Assistant

## 2017-12-09 ENCOUNTER — Other Ambulatory Visit (INDEPENDENT_AMBULATORY_CARE_PROVIDER_SITE_OTHER): Payer: Self-pay | Admitting: Physician Assistant

## 2017-12-09 DIAGNOSIS — I10 Essential (primary) hypertension: Secondary | ICD-10-CM

## 2017-12-09 MED ORDER — AMLODIPINE BESYLATE 5 MG PO TABS: 5.0000 mg | ORAL_TABLET | Freq: Every day | ORAL | 5 refills | Status: AC

## 2017-12-09 NOTE — Telephone Encounter (Signed)
Refill re-sent  

## 2017-12-09 NOTE — Telephone Encounter (Signed)
Patient called requesting medication refill for amLODipine (NORVASC) 5 MG tablet   [facility] [place]  Please Advice   Thank you

## 2017-12-10 MED FILL — ?AMLODIPINE BESYLATE 5 MG T: 5 MG | 30 days supply | Qty: 30 | Fill #0

## 2017-12-20 NOTE — Progress Notes (Signed)
Cardiology Office Note    Date:  12/21/2017   ID:  Kim Brady, DOB 17-Nov-1955, MRN [id number]  PCP:  Loletta Specter, PA-C  Cardiologist:   Donato Schultz, MD (Former Myrtis Ser)    History of Present Illness:  Kim Brady is a 62 y.o. female former patient of Dr. Myrtis Ser with prior non-ST elevation myocardial infarction in March 2016, DES  to mid RCA, doing well. Occasional burning in chest, central short lived, relieved with NTG SL. No SOB. Struggling with smoking. BP high at visits.   Previously she noted some mild dysfunctional uterine bleeding but this seems to have stopped.  12/21/2017- since her last visit blood pressure has been elevated.  Office visit on May 2019 with her primary physician shows blood pressures previously 177/85.  At her last visit it was 127/81.  Amlodipine. Legs ache. Left knee went out.  Left back discomfort with touch.  No rashes noted.  Sometimes hurts when taking a deep breath or turning to the side-likely muscular skeletal.  She is also on heartburn medication, ranitidine which she says is not helping like it should.  Still feels acid he taste in her mouth.  No fevers chills nausea vomiting syncope bleeding.  Past Medical History:  Diagnosis Date  . Chronic kidney disease   . Ejection fraction   . Essential hypertension Dx March 2016  . NSTEMI (non-ST elevated myocardial infarction) (HCC) 08/10/2014   Inferior, with drug-eluting stent to the RCA    Past Surgical History:  Procedure Laterality Date  . LEFT HEART CATHETERIZATION WITH CORONARY ANGIOGRAM N/A 08/13/2014   Procedure: LEFT HEART CATHETERIZATION WITH CORONARY ANGIOGRAM;  Surgeon: Kathleene Hazel, MD;  Location: [facility] CATH LAB;  Service: Cardiovascular;  Laterality: N/A;    Outpatient Medications Prior to Visit  Medication Sig Dispense Refill  . acetaminophen (TYLENOL) 500 MG tablet Take 1,000 mg by mouth every 6 (six) hours as needed for mild pain.    Marland Kitchen amLODipine (NORVASC) 5 MG  tablet Take 1 tablet (5 mg total) by mouth daily. 30 tablet 5  . aspirin 81 MG EC tablet Take 1 tablet (81 mg total) by mouth daily. 30 tablet 11  . atorvastatin (LIPITOR) 40 MG tablet Take 1 tablet (40 mg total) by mouth daily. 30 tablet 11  . bismuth subsalicylate (PEPTO BISMOL) 262 MG/15ML suspension Take 30 mLs by mouth every 6 (six) hours as needed for indigestion.    Marland Kitchen lisinopril (PRINIVIL,ZESTRIL) 40 MG tablet Take 1 tablet (40 mg total) by mouth daily. 90 tablet 2  . metoprolol tartrate (LOPRESSOR) 25 MG tablet Take 1 tablet (25 mg total) by mouth 2 (two) times daily. 60 tablet 11  . nitroGLYCERIN (NITROSTAT) 0.4 MG SL tablet Place 1 tablet (0.4 mg total) under the tongue every 5 (five) minutes as needed for chest pain. 25 tablet 3  . ranitidine (ZANTAC) 150 MG tablet Take 1 tablet (150 mg total) by mouth 2 (two) times daily. 60 tablet 2   No facility-administered medications prior to visit.      Allergies:   Patient has no known allergies.   Social History   Socioeconomic History  . Marital status: Legally Separated    Spouse name: Not on file  . Number of children: Not on file  . Years of education: Not on file  . Highest education level: Not on file  Occupational History  . Not on file  Social Needs  . Financial resource strain: Not on file  . Food  insecurity:    Worry: Not on file    Inability: Not on file  . Transportation needs:    Medical: Not on file    Non-medical: Not on file  Tobacco Use  . Smoking status: Current Every Day Smoker    Packs/day: 0.50    Types: Cigarettes    Last attempt to quit: 08/16/2014    Years since quitting: 3.3  . Smokeless tobacco: Never Used  Substance and Sexual Activity  . Alcohol use: No    Alcohol/week: 0.0 oz  . Drug use: No  . Sexual activity: Yes  Lifestyle  . Physical activity:    Days per week: Not on file    Minutes per session: Not on file  . Stress: Not on file  Relationships  . Social connections:    Talks on  phone: Not on file    Gets together: Not on file    Attends religious service: Not on file    Active member of club or organization: Not on file    Attends meetings of clubs or organizations: Not on file    Relationship status: Not on file  Other Topics Concern  . Not on file  Social History Narrative  . Not on file     Family History:  The patient's family history includes Heart attack in her father and mother; Hypertension in her maternal uncle and mother; Lupus in her sister.   ROS:   Please see the history of present illness.    Review of Systems  All other systems reviewed and are negative.     PHYSICAL EXAM:   VS:  BP 120/66   Pulse 62   Ht 5' (1.524 m)   Wt 151 lb 12.8 oz (68.9 kg)   SpO2 97%   BMI 29.65 kg/m    GEN: Well nourished, well developed, in no acute distress  HEENT: normal  Neck: no JVD, carotid bruits, or masses Cardiac: RRR; no murmurs, rubs, or gallops,no edema  Respiratory:  clear to auscultation bilaterally, normal work of breathing GI: soft, nontender, nondistended, + BS MS: no deformity or atrophy  Skin: warm and dry, no rash Neuro:  Alert and Oriented x 3, Strength and sensation are intact Psych: euthymic mood, full affect    Wt Readings from Last 3 Encounters:  12/21/17 151 lb 12.8 oz (68.9 kg)  10/07/17 153 lb 3.2 oz (69.5 kg)  08/27/17 154 lb 6.4 oz (70 kg)      Studies/Labs Reviewed:   EKG:  08/18/16-sinus rhythm 70 no other significant abnormalities, subtle ST segment change in lead 3. Personally viewed-prior  Sinus rhythm, 64, nonspecific ST-T wave changes, artifact on EKG.  Recent Labs: 07/16/2017: ALT 14; BUN 7; Creatinine, Ser 0.67; Hemoglobin 13.0; Platelets 341; Potassium 4.1; Sodium 143; TSH 1.420   Lipid Panel    Component Value Date/Time   CHOL 159 07/16/2017 1125   TRIG 117 07/16/2017 1125   HDL 51 07/16/2017 1125   CHOLHDL 3.1 07/16/2017 1125   CHOLHDL 4 09/18/2014 0958   VLDL 18.6 09/18/2014 0958   LDLCALC 85  07/16/2017 1125    Additional studies/ records that were reviewed today include:  Office notes reviewed, prior EKGs reviewed, lab work reviewed  Echocardiogram 08/13/2014-  - Left ventricle: The cavity size was normal. Wall thickness was normal. Systolic function was normal. The estimated ejection fraction was in the range of 60% to 65%. Wall motion was normal; there were no regional wall motion abnormalities. Left ventricular diastolic function  parameters were normal.  ASSESSMENT:    1. Coronary artery disease due to lipid rich plaque   2. Angina pectoris (HCC)   3. Essential hypertension, benign   4. History of MI (myocardial infarction)      PLAN:  In order of problems listed above:  Coronary artery disease -Stable post DES to RCA in March 2016.   Continue aspirin 81 mg. Watch for any signs of dysfunctional uterine bleeding .Doing very well since being off of the Plavix.  Normal ejection fraction.  No new anginal symptoms.  Medication compliant.  Angina  - occasional short pain, NTG resolved. Watch.  Continue with exercise.  History of MI - as above 2016, no change  Hyperlipidemia -Continuing with atorvastatin.  Continue with current medication.  LDL 80 29 June 2017  Tobacco use  - trying to quit again. Struggles. We discussed plan again today.  Pick a date that is important on the calendar.  Remind yourself that cravings will pass.  Understands risks of smoking.  Essential hypertension  -Much better control recently.  Excellent job.  Medications reviewed.  Medication Adjustments/Labs and Tests Ordered: Current medicines are reviewed at length with the patient today.  Concerns regarding medicines are outlined above.  Medication changes, Labs and Tests ordered today are listed in the Patient Instructions below. Patient Instructions  Medication Instructions: No changes in medication today  Labwork: None ordered today   Procedures/Testing: None ordered  today  Follow-Up: Your physician recommends that you schedule a follow-up appointment in: 1 year   Any Additional Special Instructions Will Be Listed Below (If Applicable).     If you need a refill on your cardiac medications before your next appointment, please call your pharmacy.        Signed, Donato SchultzMark Skains, MD  12/21/2017 9:09 AM    [facility] [address], [place], [place]  [address] Phone: [phone]) [phone]; Fax: [phone]

## 2017-12-21 ENCOUNTER — Encounter (INDEPENDENT_AMBULATORY_CARE_PROVIDER_SITE_OTHER): Payer: Self-pay

## 2017-12-21 ENCOUNTER — Ambulatory Visit (INDEPENDENT_AMBULATORY_CARE_PROVIDER_SITE_OTHER): Payer: Self-pay | Admitting: Cardiology

## 2017-12-21 ENCOUNTER — Encounter: Payer: Self-pay | Admitting: Cardiology

## 2017-12-21 VITALS — BP 120/66 | HR 62 | Ht 60.0 in | Wt 151.8 lb

## 2017-12-21 DIAGNOSIS — I252 Old myocardial infarction: Secondary | ICD-10-CM

## 2017-12-21 DIAGNOSIS — I10 Essential (primary) hypertension: Secondary | ICD-10-CM

## 2017-12-21 DIAGNOSIS — I2510 Atherosclerotic heart disease of native coronary artery without angina pectoris: Secondary | ICD-10-CM

## 2017-12-21 DIAGNOSIS — I209 Angina pectoris, unspecified: Secondary | ICD-10-CM

## 2017-12-21 DIAGNOSIS — I2583 Coronary atherosclerosis due to lipid rich plaque: Secondary | ICD-10-CM

## 2017-12-21 NOTE — Patient Instructions (Signed)
Medication Instructions: No changes in medication today  Labwork: None ordered today   Procedures/Testing: None ordered today  Follow-Up: Your physician recommends that you schedule a follow-up appointment in: 1 year   Any Additional Special Instructions Will Be Listed Below (If Applicable).     If you need a refill on your cardiac medications before your next appointment, please call your pharmacy.

## 2018-01-07 ENCOUNTER — Encounter (INDEPENDENT_AMBULATORY_CARE_PROVIDER_SITE_OTHER): Payer: Self-pay | Admitting: Physician Assistant

## 2018-01-07 ENCOUNTER — Ambulatory Visit (INDEPENDENT_AMBULATORY_CARE_PROVIDER_SITE_OTHER): Payer: Self-pay | Admitting: Physician Assistant

## 2018-01-07 ENCOUNTER — Other Ambulatory Visit: Payer: Self-pay

## 2018-01-07 VITALS — BP 121/80 | HR 65 | Temp 98.1°F | Ht 60.0 in | Wt 151.6 lb

## 2018-01-07 DIAGNOSIS — Z1231 Encounter for screening mammogram for malignant neoplasm of breast: Secondary | ICD-10-CM

## 2018-01-07 DIAGNOSIS — I10 Essential (primary) hypertension: Secondary | ICD-10-CM

## 2018-01-07 DIAGNOSIS — Z124 Encounter for screening for malignant neoplasm of cervix: Secondary | ICD-10-CM

## 2018-01-07 DIAGNOSIS — Z23 Encounter for immunization: Secondary | ICD-10-CM

## 2018-01-07 DIAGNOSIS — Z1239 Encounter for other screening for malignant neoplasm of breast: Secondary | ICD-10-CM

## 2018-01-07 NOTE — Progress Notes (Signed)
Subjective:  Patient ID: Kim Brady, female    DOB: 08-28-1955  Age: 62 y.o. MRN: [id number]  CC: f/u HTN  HPI Kim LeighVanessa L Rogersis a 62 y.o.femalewith a medical history ofCAD, HLD, HTN, MI, tobacco abuse, GERD, and dental abscess presents for f/u of HTN and GERD. Last BP 127/81 mmHg three months ago. Taking anti-hypertensives as directed. BP 121/80 mmHg today. Also keeping cardiology appointments as directed. Does not endorse CP, palpitations, SOB, HA, tingling, numbness, abdominal pain, f/c/n/v, rash, swelling, or GI/GU sxs.     Outpatient Medications Prior to Visit  Medication Sig Dispense Refill  . acetaminophen (TYLENOL) 500 MG tablet Take 1,000 mg by mouth every 6 (six) hours as needed for mild pain.    Marland Kitchen. amLODipine (NORVASC) 5 MG tablet Take 1 tablet (5 mg total) by mouth daily. 30 tablet 5  . aspirin 81 MG EC tablet Take 1 tablet (81 mg total) by mouth daily. 30 tablet 11  . atorvastatin (LIPITOR) 40 MG tablet Take 1 tablet (40 mg total) by mouth daily. 30 tablet 11  . lisinopril (PRINIVIL,ZESTRIL) 40 MG tablet Take 1 tablet (40 mg total) by mouth daily. 90 tablet 2  . metoprolol tartrate (LOPRESSOR) 25 MG tablet Take 1 tablet (25 mg total) by mouth 2 (two) times daily. 60 tablet 11  . ranitidine (ZANTAC) 150 MG tablet Take 1 tablet (150 mg total) by mouth 2 (two) times daily. 60 tablet 2  . bismuth subsalicylate (PEPTO BISMOL) 262 MG/15ML suspension Take 30 mLs by mouth every 6 (six) hours as needed for indigestion.    . nitroGLYCERIN (NITROSTAT) 0.4 MG SL tablet Place 1 tablet (0.4 mg total) under the tongue every 5 (five) minutes as needed for chest pain. 25 tablet 3   No facility-administered medications prior to visit.      ROS Review of Systems  Constitutional: Negative for chills, fever and malaise/fatigue.  Eyes: Negative for blurred vision.  Respiratory: Negative for shortness of breath.   Cardiovascular: Negative for chest pain and palpitations.   Gastrointestinal: Negative for abdominal pain and nausea.  Genitourinary: Negative for dysuria and hematuria.  Musculoskeletal: Negative for joint pain and myalgias.  Skin: Negative for rash.  Neurological: Negative for tingling and headaches.  Psychiatric/Behavioral: Negative for depression. The patient is not nervous/anxious.     Objective:  BP 121/80 (BP Location: Left Arm, Patient Position: Sitting, Cuff Size: Normal)   Pulse 65   Temp 98.1 F (36.7 C) (Oral)   Ht 5' (1.524 m)   Wt 151 lb 9.6 oz (68.8 kg)   SpO2 96%   BMI 29.61 kg/m   BP/Weight 01/07/2018 12/21/2017 10/07/2017  Systolic BP 121 120 127  Diastolic BP 80 66 81  Wt. (Lbs) 151.6 151.8 153.2  BMI 29.61 29.65 29.92      Physical Exam  Constitutional: She is oriented to person, place, and time.  Well developed, well nourished, NAD, polite  HENT:  Head: Normocephalic and atraumatic.  Eyes: No scleral icterus.  Neck: Normal range of motion. Neck supple. No thyromegaly present.  Cardiovascular: Normal rate, regular rhythm and normal heart sounds.  No LE edema bilaterally. No carotid bruit bilaterally.  Pulmonary/Chest: Effort normal and breath sounds normal.  Musculoskeletal: She exhibits no edema.  Neurological: She is alert and oriented to person, place, and time.  Skin: Skin is warm and dry. No rash noted. No erythema. No pallor.  Psychiatric: She has a normal mood and affect. Her behavior is normal. Thought content normal.  Vitals reviewed.    Assessment & Plan:    1. Hypertension, unspecified type - Lipid panel - Basic Metabolic Panel  2. Screening for breast cancer - I have messaged Ms. Stoney BangSabrina Holland to enroll pt in [facility] program for free screening.  3. Screening for cervical cancer - I have messaged Ms. Stoney BangSabrina Holland to enroll pt in [facility] program for free screening.  4. Need for Tdap vaccination - Tdap vaccine greater than or equal to 7yo IM     Follow-up: Return in about 4 months  (around 05/09/2018) for flu vaccine, f/u BP.   Loletta Specteroger David Shalia Bartko PA

## 2018-01-07 NOTE — Patient Instructions (Signed)

## 2018-01-08 LAB — BASIC METABOLIC PANEL
BUN/Creatinine Ratio: 16 (ref 12–28)
BUN: 11 mg/dL (ref 8–27)
CALCIUM: 10.2 mg/dL (ref 8.7–10.3)
CHLORIDE: 102 mmol/L (ref 96–106)
CO2: 23 mmol/L (ref 20–29)
CREATININE: 0.67 mg/dL (ref 0.57–1.00)
GFR calc Af Amer: 107 mL/min/{1.73_m2} (ref >59)
GFR calc non Af Amer: 93 mL/min/{1.73_m2} (ref >59)
GLUCOSE: 104 mg/dL — AB (ref 65–99)
Potassium: 3.9 mmol/L (ref 3.5–5.2)
Sodium: 140 mmol/L (ref 134–144)

## 2018-01-08 LAB — LIPID PANEL
CHOL/HDL RATIO: 3 ratio (ref 0.0–4.4)
Cholesterol, Total: 128 mg/dL (ref 100–199)
HDL: 42 mg/dL (ref >39)
LDL Calculated: 71 mg/dL (ref 0–99)
Triglycerides: 75 mg/dL (ref 0–149)
VLDL Cholesterol Cal: 15 mg/dL (ref 5–40)

## 2018-01-10 ENCOUNTER — Telehealth (INDEPENDENT_AMBULATORY_CARE_PROVIDER_SITE_OTHER): Payer: Self-pay

## 2018-01-10 ENCOUNTER — Other Ambulatory Visit (INDEPENDENT_AMBULATORY_CARE_PROVIDER_SITE_OTHER): Payer: Self-pay | Admitting: Physician Assistant

## 2018-01-10 NOTE — Telephone Encounter (Signed)
Patient was called and informed of lab results. 

## 2018-01-10 NOTE — Telephone Encounter (Signed)
-----   Message from Loletta Specteroger David Gomez, PA-C sent at 01/10/2018  8:51 AM EDT ----- Cholesterol normal, continue taking atorvastatin. Rest of labs normal.

## 2018-01-11 ENCOUNTER — Other Ambulatory Visit (INDEPENDENT_AMBULATORY_CARE_PROVIDER_SITE_OTHER): Payer: Self-pay | Admitting: Nurse Practitioner

## 2018-01-11 ENCOUNTER — Other Ambulatory Visit: Payer: Self-pay | Admitting: Cardiology

## 2018-01-11 DIAGNOSIS — I10 Essential (primary) hypertension: Secondary | ICD-10-CM

## 2018-01-11 DIAGNOSIS — K219 Gastro-esophageal reflux disease without esophagitis: Secondary | ICD-10-CM

## 2018-01-11 MED FILL — ?ATORVASTATIN 40MG TABLET: 40 | 30 days supply | Qty: 30 | Fill #3

## 2018-01-11 NOTE — Telephone Encounter (Signed)
rder Providers   Prescribing Provider Encounter Provider  [name], [name], PA-C [name], [name], PA-C  Supervision Information   Supervising Provider Type of Supervision  Quentin AngstJegede, Olugbemiga E, MD Supervision Required  Outpatient Medication Detail    Disp Refills Start End   metoprolol tartrate (LOPRESSOR) 25 MG tablet 60 tablet 11 07/16/2017    Sig - Route: Take 1 tablet (25 mg total) by mouth 2 (two) times daily. - Oral   Sent to pharmacy as: metoprolol tartrate (LOPRESSOR) 25 MG tablet   E-Prescribing Status: Receipt confirmed by pharmacy (07/16/2017 11:24 AM EST)   Associated Diagnoses   Hypertension, unspecified type     Pharmacy

## 2018-01-11 NOTE — Telephone Encounter (Signed)
Order Providers   Prescribing Provider Encounter Provider  Jake BatheSkains, Mark C, MD Jake BatheSkains, Mark C, MD  Outpatient Medication Detail    Disp Refills Start End   lisinopril (PRINIVIL,ZESTRIL) 40 MG tablet 90 tablet 2 12/07/2017    Sig - Route: Take 1 tablet (40 mg total) by mouth daily. - Oral   Sent to pharmacy as: lisinopril (PRINIVIL,ZESTRIL) 40 MG tablet   E-Prescribing Status: Receipt confirmed by pharmacy (12/07/2017 3:05 PM EDT)   Associated Diagnoses   Hypertension, unspecified type     Pharmacy   [facility] - [place], [place] - [address]

## 2018-01-12 MED FILL — raNITIdine HCL 150 MG TABS: 150 | 30 days supply | Qty: 60 | Fill #0

## 2018-01-13 ENCOUNTER — Ambulatory Visit: Payer: Self-pay | Attending: Physician Assistant

## 2018-01-13 MED FILL — ?AMLODIPINE BESYLATE 5 MG T: 5 MG | 30 days supply | Qty: 30 | Fill #1

## 2018-01-13 MED FILL — ?METOPROLOL TARTRATE 25 MG: 25 | 30 days supply | Qty: 60 | Fill #1

## 2018-01-13 MED FILL — LISINOPRIL 40 MG TABLET: 40 | 30 days supply | Qty: 30 | Fill #1

## 2018-01-28 ENCOUNTER — Other Ambulatory Visit: Payer: Self-pay | Admitting: Obstetrics and Gynecology

## 2018-01-28 DIAGNOSIS — Z1231 Encounter for screening mammogram for malignant neoplasm of breast: Secondary | ICD-10-CM

## 2018-02-15 ENCOUNTER — Ambulatory Visit (INDEPENDENT_AMBULATORY_CARE_PROVIDER_SITE_OTHER): Payer: Self-pay | Admitting: Physician Assistant

## 2018-02-23 ENCOUNTER — Other Ambulatory Visit: Payer: Self-pay | Admitting: Cardiology

## 2018-02-23 DIAGNOSIS — I10 Essential (primary) hypertension: Secondary | ICD-10-CM

## 2018-02-23 MED ORDER — LISINOPRIL 40 MG PO TABS: 40.0000 mg | ORAL_TABLET | Freq: Every day | ORAL | 2 refills | Status: AC

## 2018-02-23 MED FILL — ?ATORVASTATIN 40MG TABLET: 40 | 30 days supply | Qty: 30 | Fill #4

## 2018-02-23 MED FILL — ?AMLODIPINE BESYLATE 5 MG T: 5 MG | 30 days supply | Qty: 30 | Fill #2

## 2018-02-23 MED FILL — raNITIdine HCL 150 MG TABS: 150 | 30 days supply | Qty: 60 | Fill #1

## 2018-02-23 MED FILL — ?METOPROLOL TARTRATE 25 MG: 25 | 30 days supply | Qty: 60 | Fill #2

## 2018-02-23 MED FILL — LISINOPRIL 40 MG TABLET: 40 | 30 days supply | Qty: 30 | Fill #0

## 2018-04-12 ENCOUNTER — Ambulatory Visit
Admission: RE | Admit: 2018-04-12 | Discharge: 2018-04-12 | Disposition: A | Discharge status: Home / Self Care | Payer: No Typology Code available for payment source | Source: Ambulatory Visit | Attending: Obstetrics and Gynecology | Admitting: Obstetrics and Gynecology

## 2018-04-12 ENCOUNTER — Encounter (HOSPITAL_COMMUNITY): Payer: Self-pay

## 2018-04-12 ENCOUNTER — Ambulatory Visit (HOSPITAL_COMMUNITY)
Admission: RE | Admit: 2018-04-12 | Discharge: 2018-04-12 | Disposition: A | Discharge status: Home / Self Care | Payer: Self-pay | Source: Ambulatory Visit | Attending: Obstetrics and Gynecology | Admitting: Obstetrics and Gynecology

## 2018-04-12 VITALS — BP 112/74 | Wt 153.0 lb

## 2018-04-12 DIAGNOSIS — Z1231 Encounter for screening mammogram for malignant neoplasm of breast: Secondary | ICD-10-CM

## 2018-04-12 DIAGNOSIS — Z01419 Encounter for gynecological examination (general) (routine) without abnormal findings: Secondary | ICD-10-CM

## 2018-04-12 HISTORY — DX: Gastro-esophageal reflux disease without esophagitis: K21.9

## 2018-04-12 NOTE — Addendum Note (Signed)
Encounter addended by: Priscille HeidelbergBrannock, Kasandra Fehr P, RN on: 04/12/2018 1:09 PM  Actions taken: Sign clinical note

## 2018-04-12 NOTE — Patient Instructions (Signed)
Explained breast self awareness with Kim Brady. Let patient know B[facility]CP will cover Pap smears and HPV typing every 5 years unless has a history of abnormal Pap smears. [facility]. Appointment scheduled for Tuesday, April 12, 2018 at 1240. Patient aware of appointment and will be there. Let patient know will follow up with her within the next couple weeks with results of Pap smear by letter or phone. Informed patient that the Breast Center will follow-up with her within the next couple of weeks with results of mammogram by letter or phone. Discussed smoking cessation with patient. Referred to the [facility] and gave resources to free smoking cessation classes at [facility]. Kim Brady verbalized understanding.  Garrett Mitchum, Kathaleen Maserhristine Poll, RN 12:26 PM

## 2018-04-12 NOTE — Progress Notes (Addendum)
No complaints today.   Pap Smear: Pap smear completed today. Last Pap smear was 02/25/2010 and normal with negative HPV. Per patient has no history of an abnormal Pap smear. Last Pap smear result is in Epic.  Physical exam: Breasts Breasts symmetrical. No skin abnormalities bilateral breasts. No nipple retraction bilateral breasts. No nipple discharge bilateral breasts. No lymphadenopathy. No lumps palpated bilateral breasts. No complaints of pain or tenderness on exam. [facility] for a screening mammogram. Appointment scheduled for Tuesday, April 12, 2018 at 1240.        Pelvic/Bimanual   Ext Genitalia No lesions, no swelling and no discharge observed on external genitalia.         Vagina Vagina pink and normal texture. Vagina stenotic. No lesions or discharge observed in vagina.          Cervix Cervix is present. Cervix pink and of normal texture. No discharge observed.     Uterus Uterus is present and palpable. Uterus in normal position and normal size.        Adnexae Bilateral ovaries present and palpable. No tenderness on palpation.         Rectovaginal No rectal exam completed today since patient had no rectal complaints. No skin abnormalities observed on exam.    Smoking History: Patient is a current smoker. Discussed smoking cessation with patient. Referred to the [facility] and gave resources to free smoking cessation classes at [facility].  Patient Navigation: Patient education provided. Access to services provided for patient through BCCCP program.   Colorectal Cancer Screening: Patient had a colonoscopy completed 08/30/2017. No complaints today.   Breast and Cervical Cancer Risk Assessment: Patient has no family history of breast cancer, known genetic mutations, or radiation treatment to the chest before age 64. Patient has no history of cervical dysplasia, immunocompromised, or DES exposure in-utero.  Risk Assessment    Risk Scores      04/12/2018   Last edited by: Lynnell DikeHolland, Sabrina H, LPN   5-year risk: 1.3 %   Lifetime risk: 5.1 %

## 2018-04-13 ENCOUNTER — Inpatient Hospital Stay (HOSPITAL_COMMUNITY)
Admission: EM | Admit: 2018-04-13 | Discharge: 2018-04-24 | DRG: 545 | Disposition: E | Payer: Medicaid Other | Attending: Internal Medicine | Admitting: Internal Medicine

## 2018-04-13 ENCOUNTER — Emergency Department (HOSPITAL_COMMUNITY): Payer: Medicaid Other

## 2018-04-13 ENCOUNTER — Other Ambulatory Visit: Payer: Self-pay | Admitting: Cardiology

## 2018-04-13 ENCOUNTER — Encounter (HOSPITAL_COMMUNITY): Payer: Self-pay | Admitting: Emergency Medicine

## 2018-04-13 DIAGNOSIS — M311 Thrombotic microangiopathy: Principal | ICD-10-CM | POA: Diagnosis present

## 2018-04-13 DIAGNOSIS — Z79899 Other long term (current) drug therapy: Secondary | ICD-10-CM

## 2018-04-13 DIAGNOSIS — Z452 Encounter for adjustment and management of vascular access device: Secondary | ICD-10-CM

## 2018-04-13 DIAGNOSIS — I252 Old myocardial infarction: Secondary | ICD-10-CM

## 2018-04-13 DIAGNOSIS — N390 Urinary tract infection, site not specified: Secondary | ICD-10-CM | POA: Diagnosis present

## 2018-04-13 DIAGNOSIS — M3119 Other thrombotic microangiopathy: Secondary | ICD-10-CM | POA: Diagnosis present

## 2018-04-13 DIAGNOSIS — R109 Unspecified abdominal pain: Secondary | ICD-10-CM

## 2018-04-13 DIAGNOSIS — R310 Gross hematuria: Secondary | ICD-10-CM | POA: Diagnosis present

## 2018-04-13 DIAGNOSIS — Z7982 Long term (current) use of aspirin: Secondary | ICD-10-CM

## 2018-04-13 DIAGNOSIS — Z978 Presence of other specified devices: Secondary | ICD-10-CM

## 2018-04-13 DIAGNOSIS — B9620 Unspecified Escherichia coli [E. coli] as the cause of diseases classified elsewhere: Secondary | ICD-10-CM | POA: Diagnosis present

## 2018-04-13 DIAGNOSIS — N179 Acute kidney failure, unspecified: Secondary | ICD-10-CM | POA: Diagnosis present

## 2018-04-13 DIAGNOSIS — I10 Essential (primary) hypertension: Secondary | ICD-10-CM | POA: Diagnosis present

## 2018-04-13 DIAGNOSIS — F17210 Nicotine dependence, cigarettes, uncomplicated: Secondary | ICD-10-CM | POA: Diagnosis present

## 2018-04-13 DIAGNOSIS — N182 Chronic kidney disease, stage 2 (mild): Secondary | ICD-10-CM | POA: Diagnosis present

## 2018-04-13 DIAGNOSIS — E785 Hyperlipidemia, unspecified: Secondary | ICD-10-CM | POA: Diagnosis present

## 2018-04-13 DIAGNOSIS — I129 Hypertensive chronic kidney disease with stage 1 through stage 4 chronic kidney disease, or unspecified chronic kidney disease: Secondary | ICD-10-CM | POA: Diagnosis present

## 2018-04-13 DIAGNOSIS — Z955 Presence of coronary angioplasty implant and graft: Secondary | ICD-10-CM

## 2018-04-13 DIAGNOSIS — K859 Acute pancreatitis without necrosis or infection, unspecified: Secondary | ICD-10-CM | POA: Diagnosis present

## 2018-04-13 DIAGNOSIS — D62 Acute posthemorrhagic anemia: Secondary | ICD-10-CM | POA: Diagnosis present

## 2018-04-13 DIAGNOSIS — I2510 Atherosclerotic heart disease of native coronary artery without angina pectoris: Secondary | ICD-10-CM | POA: Diagnosis present

## 2018-04-13 DIAGNOSIS — Z0189 Encounter for other specified special examinations: Secondary | ICD-10-CM

## 2018-04-13 DIAGNOSIS — I4901 Ventricular fibrillation: Secondary | ICD-10-CM | POA: Diagnosis not present

## 2018-04-13 DIAGNOSIS — E7800 Pure hypercholesterolemia, unspecified: Secondary | ICD-10-CM | POA: Diagnosis present

## 2018-04-13 DIAGNOSIS — K5900 Constipation, unspecified: Secondary | ICD-10-CM | POA: Diagnosis present

## 2018-04-13 DIAGNOSIS — I469 Cardiac arrest, cause unspecified: Secondary | ICD-10-CM | POA: Diagnosis not present

## 2018-04-13 DIAGNOSIS — N10 Acute tubulo-interstitial nephritis: Secondary | ICD-10-CM | POA: Diagnosis present

## 2018-04-13 DIAGNOSIS — K219 Gastro-esophageal reflux disease without esophagitis: Secondary | ICD-10-CM | POA: Diagnosis present

## 2018-04-13 HISTORY — DX: Atherosclerotic heart disease of native coronary artery without angina pectoris: I25.10

## 2018-04-13 LAB — HEPATIC FUNCTION PANEL
ALT: 17 U/L (ref 0–44)
AST: 23 U/L (ref 15–41)
Albumin: 4.3 g/dL (ref 3.5–5.0)
Alkaline Phosphatase: 66 U/L (ref 38–126)
BILIRUBIN DIRECT: 0.4 mg/dL — AB (ref 0.0–0.2)
BILIRUBIN INDIRECT: 3.6 mg/dL — AB (ref 0.3–0.9)
BILIRUBIN TOTAL: 4 mg/dL — AB (ref 0.3–1.2)
Total Protein: 8.1 g/dL (ref 6.5–8.1)

## 2018-04-13 LAB — BASIC METABOLIC PANEL
Anion gap: 11 (ref 5–15)
BUN: 13 mg/dL (ref 8–23)
CO2: 19 mmol/L — ABNORMAL LOW (ref 22–32)
CREATININE: 0.93 mg/dL (ref 0.44–1.00)
Calcium: 9.6 mg/dL (ref 8.9–10.3)
Chloride: 107 mmol/L (ref 98–111)
GFR calc Af Amer: >60 mL/min (ref >60)
GLUCOSE: 110 mg/dL — AB (ref 70–99)
Potassium: 3.1 mmol/L — ABNORMAL LOW (ref 3.5–5.1)
SODIUM: 137 mmol/L (ref 135–145)

## 2018-04-13 LAB — CBC
HEMATOCRIT: 37.7 % (ref 36.0–46.0)
HEMOGLOBIN: 12.6 g/dL (ref 12.0–15.0)
MCH: 29 pg (ref 26.0–34.0)
MCHC: 33.4 g/dL (ref 30.0–36.0)
MCV: 86.7 fL (ref 80.0–100.0)
Platelets: 37 10*3/uL — ABNORMAL LOW (ref 150–400)
RBC: 4.35 MIL/uL (ref 3.87–5.11)
RDW: 14.6 % (ref 11.5–15.5)
WBC: 6.6 10*3/uL (ref 4.0–10.5)
nRBC: 0 % (ref 0.0–0.2)

## 2018-04-13 LAB — I-STAT TROPONIN, ED: TROPONIN I, POC: 0 ng/mL (ref 0.00–0.08)

## 2018-04-13 LAB — LIPASE, BLOOD: Lipase: 52 U/L — ABNORMAL HIGH (ref 11–51)

## 2018-04-13 MED ORDER — SODIUM CHLORIDE 0.9 % IV BOLUS
1000.0000 mL | Freq: Once | INTRAVENOUS | Status: AC
  Administered 2018-04-14: 1000 mL via INTRAVENOUS

## 2018-04-13 MED ORDER — ALUM & MAG HYDROXIDE-SIMETH 200-200-20 MG/5ML PO SUSP
30.0000 mL | Freq: Once | ORAL | Status: AC
  Administered 2018-04-13: 30 mL via ORAL
  Filled 2018-04-13: qty 30

## 2018-04-13 MED ORDER — ONDANSETRON HCL 4 MG/2ML IJ SOLN
4.0000 mg | Freq: Once | INTRAMUSCULAR | Status: AC
  Administered 2018-04-13: 4 mg via INTRAVENOUS
  Filled 2018-04-13: qty 2

## 2018-04-13 MED ORDER — MORPHINE SULFATE (PF) 4 MG/ML IV SOLN
4.0000 mg | Freq: Once | INTRAVENOUS | Status: AC
  Administered 2018-04-13: 4 mg via INTRAVENOUS
  Filled 2018-04-13: qty 1

## 2018-04-13 NOTE — ED Notes (Signed)
Pts SpO2 dropped to 86.  Pt was drowsy and sleepy.  Applied O2 through nasal cannula 2L/min and SpO2 increased to 98.

## 2018-04-13 NOTE — ED Notes (Signed)
Pt states her pain began at about 1900 this date. Pt reports no SOB, Dizziness, Nausea, or vomiting. Pt states the pain radiates to the left arm, left side of her neck, back, and head.

## 2018-04-13 NOTE — ED Provider Notes (Signed)
[facility] EMERGENCY DEPARTMENT Provider Note   CSN: [id number] Arrival date & time: 04-20-18  2009     History   Chief Complaint Chief Complaint  Patient presents with  . Chest Pain    HPI Kim Brady is a 62 y.o. female.  HPI Patient is a 62 year old female presents to the emergency department with complaints of acute onset upper abdominal pain and lower chest pain.  This began abruptly this evening while she was preparing to eat.  She developed nausea without vomiting.  No fevers or chills.  No anterior chest pain.  Reports the pain is worse with palpation of her upper abdomen.  No diarrhea.  Denies back pain.  Denies weakness of her arms or legs.  Symptoms are moderate in severity.  She was given aspirin and nitroglycerin by EMS.  She reported the pain was burning like.  She does have a history of coronary artery disease.  Symptoms are mild in severity.   Past Medical History:  Diagnosis Date  . Chronic kidney disease   . Coronary artery disease   . Ejection fraction   . Essential hypertension Dx March 2016  . GERD (gastroesophageal reflux disease)   . NSTEMI (non-ST elevated myocardial infarction) (HCC) 08/10/2014   Inferior, with drug-eluting stent to the RCA    Patient Active Problem List   Diagnosis Date Noted  . Pure hypercholesterolemia 08/18/2016  . History of MI (myocardial infarction) 08/18/2016  . Former smoker 08/18/2016  . Coronary artery disease due to lipid rich plaque 02/11/2015  . Shortness of breath 02/11/2015  . Hyperlipidemia 02/11/2015  . Nosebleed 02/11/2015  . Ejection fraction   . Healthcare maintenance 10/26/2014  . Screening for HIV (human immunodeficiency virus) 10/26/2014  . Essential hypertension, benign 08/16/2014  . Tobacco abuse 08/11/2014    Past Surgical History:  Procedure Laterality Date  . CORONARY STENT PLACEMENT    . LEFT HEART CATHETERIZATION WITH CORONARY ANGIOGRAM N/A 08/13/2014   Procedure:  LEFT HEART CATHETERIZATION WITH CORONARY ANGIOGRAM;  Surgeon: Kathleene Hazel, MD;  Location[facility] C[facility] LAB;  Service: Cardiovascular;  Laterality: N/A;     OB History    Gravida  0   Para  0   Term  0   Preterm  0   AB  0   Living  0     SAB  0   TAB  0   Ectopic  0   Multiple  0   Live Births  0            Home Medications    Prior to Admission medications   Medication Sig Start Date End Date Taking? Authorizing Provider  acetaminophen (TYLENOL) 500 MG tablet Take 1,000 mg by mouth every 6 (six) hours as needed for mild pain.   Yes [provider]  amLODipine (NORVASC) 5 MG tablet Take 1 tablet (5 mg total) by mouth daily. 12/09/17  Yes Loletta Specter, PA-C  aspirin 81 MG EC tablet Take 1 tablet (81 mg total) by mouth daily. 10/26/14  Yes Funches, Josalyn, MD  atorvastatin (LIPITOR) 40 MG tablet Take 1 tablet (40 mg total) by mouth daily. 07/16/17  Yes Loletta Specter, PA-C  bismuth subsalicylate (PEPTO BISMOL) 262 MG/15ML suspension Take 30 mLs by mouth every 6 (six) hours as needed for indigestion.   Yes [provider]  lisinopril (PRINIVIL,ZESTRIL) 40 MG tablet Take 1 tablet (40 mg total) by mouth daily. 02/23/18  Yes Jake Bathe, MD  metoprolol tartrate (LOPRESSOR) 25 MG tablet Take 1 tablet (25 mg total) by mouth 2 (two) times daily. 07/16/17  Yes [name], [name], PA-C  nitroGLYCERIN (NITROSTAT) 0.4 MG SL tablet Place 1 tablet (0.4 mg total) under the tongue every 5 (five) minutes as needed for chest pain. 07/16/17  Yes [name], [name], PA-C  ranitidine (ZANTAC) 150 MG tablet TAKE 1 TABLET BY MOUTH 2 TIMES DAILY. 01/11/18  Yes [name], [name], PA-C    Family History Family History  Problem Relation Age of Onset  . Heart attack Mother   . Hypertension Mother   . Heart attack Father   . Lupus Sister   . Hypertension Maternal Uncle   . Stroke Neg Hx     Social History Social History   Tobacco Use  . Smoking status:  Current Some Day Smoker    Packs/day: 0.50    Types: Cigarettes    Last attempt to quit: 08/16/2014    Years since quitting: 3.6  . Smokeless tobacco: Never Used  Substance Use Topics  . Alcohol use: Yes    Alcohol/week: 0.0 standard drinks  . Drug use: No     Allergies   Patient has no known allergies.   Review of Systems Review of Systems  All other systems reviewed and are negative.    Physical Exam Updated Vital Signs BP 121/81   Pulse 72   Temp (!) 97.5 F (36.4 C) (Oral)   Resp 15   SpO2 99%   Physical Exam  Constitutional: She is oriented to person, place, and time. She appears well-developed and well-nourished. No distress.  HENT:  Head: Normocephalic and atraumatic.  Eyes: EOM are normal.  Neck: Normal range of motion.  Cardiovascular: Normal rate, regular rhythm and normal heart sounds.  Pulmonary/Chest: Effort normal and breath sounds normal.  Abdominal: Soft. She exhibits no distension.  Upper abdominal tenderness  Musculoskeletal: Normal range of motion.  Neurological: She is alert and oriented to person, place, and time.  Skin: Skin is warm and dry.  Psychiatric: She has a normal mood and affect. Judgment normal.  Nursing note and vitals reviewed.    ED Treatments / Results  Labs (all labs ordered are listed, but only abnormal results are displayed) Labs Reviewed  BASIC METABOLIC PANEL - Abnormal; Notable for the following components:      Result Value   Potassium 3.1 (*)    CO2 19 (*)    Glucose, Bld 110 (*)    All other components within normal limits  CBC - Abnormal; Notable for the following components:   Platelets 37 (*)    All other components within normal limits  HEPATIC FUNCTION PANEL - Abnormal; Notable for the following components:   Total Bilirubin 4.0 (*)    Bilirubin, Direct 0.4 (*)    Indirect Bilirubin 3.6 (*)    All other components within normal limits  LIPASE, BLOOD - Abnormal; Notable for the following components:    Lipase 52 (*)    All other components within normal limits  TROPONIN I  CBC WITH DIFFERENTIAL/PLATELET  PROTIME-INR  I-STAT TROPONIN, ED  TYPE AND SCREEN    EKG EKG Interpretation  Date/Time:  Wednesday April 13 2018 20:11:03 EST Ventricular Rate:  92 PR Interval:    QRS Duration: 94 QT Interval:  418 QTC Calculation: 518 R Axis:   31 Text Interpretation:  Sinus rhythm Prolonged QT interval No significant change was found Confirmed by Azalia Bilisampos, Domenique Southers ([id number]) on 12/20/2017 8:29:59 PM  Radiology Dg Abdomen Acute W/chest  Result Date: 04-29-18 CLINICAL DATA:  Shortness of breath.  Dizziness. EXAM: DG ABDOMEN ACUTE W/ 1V CHEST COMPARISON:  None. FINDINGS: There is no evidence of dilated bowel loops or free intraperitoneal air. No radiopaque calculi or other significant radiographic abnormality is seen. Heart size and mediastinal contours are within normal limits. Both lungs are clear. IMPRESSION: Negative abdominal radiographs.  No acute cardiopulmonary disease. Electronically Signed   By: Gerome Sam III M.D   On: 04/29/2018 21:32     Procedures Procedures (including critical care time)  Medications Ordered in ED Medications  sodium chloride 0.9 % bolus 1,000 mL (has no administration in time range)  morphine 4 MG/ML injection 4 mg (4 mg Intravenous Given Apr 29, 2018 2059)  alum & mag hydroxide-simeth (MAALOX/MYLANTA) 200-200-20 MG/5ML suspension 30 mL (30 mLs Oral Given April 29, 2018 2100)  ondansetron (ZOFRAN) injection 4 mg (4 mg Intravenous Given 2018-04-29 2237)  morphine 4 MG/ML injection 4 mg (4 mg Intravenous Given 2018-04-29 2238)     Initial Impression / Assessment and Plan / ED Course  I have reviewed the triage vital signs and the nursing notes.  Pertinent labs & imaging results that were available during my care of the patient were reviewed by me and considered in my medical decision making (see chart for details).     11:50 PM Patient still with mild upper  abdominal discomfort at this time.  New bilirubin of 4.  Continues to have mild upper abdominal pain and nausea.  Patient will undergo CT imaging at this time for further evaluation.  Care to Dr Erma Heritage  Final Clinical Impressions(s) / ED Diagnoses   Final diagnoses:  None    ED Discharge Orders    None       Azalia Bilis, MD 04-29-18 [id number]

## 2018-04-13 NOTE — ED Notes (Signed)
Pt. returned from XR. 

## 2018-04-13 NOTE — ED Triage Notes (Signed)
Patient arrived with EMS from home reports central chest pain onset this evening , denies SOB , no nausea or diaphoresis , she received ASA 325 mg and 2 NTG sl with mild relief , rates pain 7/10 " burning" , history of CAD/Coronary Stent placement .

## 2018-04-13 NOTE — Telephone Encounter (Signed)
Outpatient Medication Detail    Disp Refills Start End   lisinopril (PRINIVIL,ZESTRIL) 40 MG tablet 90 tablet 2 02/23/2018    Sig - Route: Take 1 tablet (40 mg total) by mouth daily. - Oral   Sent to pharmacy as: lisinopril (PRINIVIL,ZESTRIL) 40 MG tablet   E-Prescribing Status: Receipt confirmed by pharmacy (02/23/2018 3:12 PM EDT)   Associated Diagnoses   Hypertension, unspecified type     Pharmacy   [facility] - [place], [place] - [address]

## 2018-04-13 NOTE — ED Notes (Signed)
Patient transported to X-ray 

## 2018-04-13 NOTE — ED Provider Notes (Signed)
Assumed care from Dr. Patria Maneampos at 11:26 PM. Briefly, the patient is a 62 y.o. female with PMHx of  has a past medical history of Chronic kidney disease, Coronary artery disease, Ejection fraction, Essential hypertension (Dx March 2016), GERD (gastroesophageal reflux disease), and NSTEMI (non-ST elevated myocardial infarction) (HCC) (08/10/2014). here with upper abdominal and chest pain.  Labs Reviewed  BASIC METABOLIC PANEL - Abnormal; Notable for the following components:      Result Value   Potassium 3.1 (*)    CO2 19 (*)    Glucose, Bld 110 (*)    All other components within normal limits  CBC - Abnormal; Notable for the following components:   Platelets 37 (*)    All other components within normal limits  HEPATIC FUNCTION PANEL - Abnormal; Notable for the following components:   Total Bilirubin 4.0 (*)    Bilirubin, Direct 0.4 (*)    Indirect Bilirubin 3.6 (*)    All other components within normal limits  LIPASE, BLOOD - Abnormal; Notable for the following components:   Lipase 52 (*)    All other components within normal limits  I-STAT TROPONIN, ED    Course of Care: -On my review of patient's imaging, and lab work, I am concerned about potential early acquired TTP.  Patient has significant, new thrombocytopenia, elevated indirect bilirubin, abdominal pain.  On my interview, she does report some gross hematuria several days ago. Will check hemolysis labs. -Lab work is concerning for acute hemolysis, possibly TTP.  There are schistocytes on smear.  LDH 982.  UA with gross hematuria, also mild pyuria.  Dr. Darnelle CatalanMagrinat of hematology/oncology consulted, recommends transfusing 1 unit of platelets now and he will see the patient in the hospital.  Admit to medicine for now. -UA with possible UTI. Given findings on CT and CVAT, concern for possible pyelo. E. Coli UTI w/ TTP would make sense clinically. I'm hesitant to start ABX at this time w/o plasmapharesis arranged. Will re-discuss w/ Dr.  Darnelle CatalanMagrinat. -D/w Dr. Darnelle CatalanMagrinat. Pt will need plasmaphresis. Discussed with [facility] intensivist, who will have someone come talk to pt to place Vas-Cath. Hospitalist updated. Per Dr. Darnelle CatalanMagrinat, will start empiric ABX at this time. Rocephin ordered.  CRITICAL CARE Performed by: Dollene Clevelandameron Isascs   Total critical care time: 55 minutes  Critical care time was exclusive of separately billable procedures and treating other patients.  Critical care was necessary to treat or prevent imminent or life-threatening deterioration.  Critical care was time spent personally by me on the following activities: development of treatment plan with patient and/or surrogate as well as nursing, discussions with consultants, evaluation of patient's response to treatment, examination of patient, obtaining history from patient or surrogate, ordering and performing treatments and interventions, ordering and review of laboratory studies, ordering and review of radiographic studies, pulse oximetry and re-evaluation of patient's condition.      Shaune PollackIsaacs, Jadian Karman, MD 04/14/18 [phone]

## 2018-04-14 ENCOUNTER — Inpatient Hospital Stay (HOSPITAL_COMMUNITY): Payer: Medicaid Other

## 2018-04-14 ENCOUNTER — Other Ambulatory Visit: Payer: Self-pay | Admitting: Oncology

## 2018-04-14 ENCOUNTER — Emergency Department (HOSPITAL_COMMUNITY): Payer: Medicaid Other

## 2018-04-14 DIAGNOSIS — F17210 Nicotine dependence, cigarettes, uncomplicated: Secondary | ICD-10-CM

## 2018-04-14 DIAGNOSIS — E785 Hyperlipidemia, unspecified: Secondary | ICD-10-CM | POA: Diagnosis present

## 2018-04-14 DIAGNOSIS — N12 Tubulo-interstitial nephritis, not specified as acute or chronic: Secondary | ICD-10-CM

## 2018-04-14 DIAGNOSIS — M3119 Other thrombotic microangiopathy: Secondary | ICD-10-CM | POA: Diagnosis present

## 2018-04-14 DIAGNOSIS — Z955 Presence of coronary angioplasty implant and graft: Secondary | ICD-10-CM | POA: Diagnosis not present

## 2018-04-14 DIAGNOSIS — I4901 Ventricular fibrillation: Secondary | ICD-10-CM | POA: Diagnosis not present

## 2018-04-14 DIAGNOSIS — I2510 Atherosclerotic heart disease of native coronary artery without angina pectoris: Secondary | ICD-10-CM | POA: Diagnosis present

## 2018-04-14 DIAGNOSIS — N179 Acute kidney failure, unspecified: Secondary | ICD-10-CM | POA: Diagnosis present

## 2018-04-14 DIAGNOSIS — N10 Acute tubulo-interstitial nephritis: Secondary | ICD-10-CM

## 2018-04-14 DIAGNOSIS — I252 Old myocardial infarction: Secondary | ICD-10-CM | POA: Diagnosis not present

## 2018-04-14 DIAGNOSIS — Z79899 Other long term (current) drug therapy: Secondary | ICD-10-CM | POA: Diagnosis not present

## 2018-04-14 DIAGNOSIS — K219 Gastro-esophageal reflux disease without esophagitis: Secondary | ICD-10-CM | POA: Diagnosis present

## 2018-04-14 DIAGNOSIS — I469 Cardiac arrest, cause unspecified: Secondary | ICD-10-CM | POA: Diagnosis not present

## 2018-04-14 DIAGNOSIS — N189 Chronic kidney disease, unspecified: Secondary | ICD-10-CM

## 2018-04-14 DIAGNOSIS — Z7982 Long term (current) use of aspirin: Secondary | ICD-10-CM | POA: Diagnosis not present

## 2018-04-14 DIAGNOSIS — M311 Thrombotic microangiopathy: Secondary | ICD-10-CM | POA: Diagnosis present

## 2018-04-14 DIAGNOSIS — R112 Nausea with vomiting, unspecified: Secondary | ICD-10-CM | POA: Diagnosis present

## 2018-04-14 DIAGNOSIS — K5900 Constipation, unspecified: Secondary | ICD-10-CM | POA: Diagnosis present

## 2018-04-14 DIAGNOSIS — B9620 Unspecified Escherichia coli [E. coli] as the cause of diseases classified elsewhere: Secondary | ICD-10-CM | POA: Diagnosis present

## 2018-04-14 DIAGNOSIS — D62 Acute posthemorrhagic anemia: Secondary | ICD-10-CM | POA: Diagnosis present

## 2018-04-14 DIAGNOSIS — I10 Essential (primary) hypertension: Secondary | ICD-10-CM

## 2018-04-14 DIAGNOSIS — R310 Gross hematuria: Secondary | ICD-10-CM

## 2018-04-14 DIAGNOSIS — K859 Acute pancreatitis without necrosis or infection, unspecified: Secondary | ICD-10-CM | POA: Diagnosis present

## 2018-04-14 DIAGNOSIS — I129 Hypertensive chronic kidney disease with stage 1 through stage 4 chronic kidney disease, or unspecified chronic kidney disease: Secondary | ICD-10-CM | POA: Diagnosis present

## 2018-04-14 DIAGNOSIS — E7800 Pure hypercholesterolemia, unspecified: Secondary | ICD-10-CM | POA: Diagnosis present

## 2018-04-14 DIAGNOSIS — N182 Chronic kidney disease, stage 2 (mild): Secondary | ICD-10-CM | POA: Diagnosis present

## 2018-04-14 DIAGNOSIS — R740 Nonspecific elevation of levels of transaminase and lactic acid dehydrogenase [LDH]: Secondary | ICD-10-CM

## 2018-04-14 DIAGNOSIS — N390 Urinary tract infection, site not specified: Secondary | ICD-10-CM | POA: Diagnosis present

## 2018-04-14 LAB — URINALYSIS, ROUTINE W REFLEX MICROSCOPIC
BILIRUBIN URINE: NEGATIVE
Glucose, UA: 50 mg/dL — AB
KETONES UR: NEGATIVE mg/dL
LEUKOCYTES UA: NEGATIVE
NITRITE: NEGATIVE
PH: 7 (ref 5.0–8.0)
Specific Gravity, Urine: 1.023 (ref 1.005–1.030)

## 2018-04-14 LAB — CBC WITH DIFFERENTIAL/PLATELET
Abs Immature Granulocytes: 0.13 10*3/uL — ABNORMAL HIGH (ref 0.00–0.07)
BASOS PCT: 0 %
Basophils Absolute: 0.1 10*3/uL (ref 0.0–0.1)
EOS ABS: 0 10*3/uL (ref 0.0–0.5)
Eosinophils Relative: 0 %
HCT: 35 % — ABNORMAL LOW (ref 36.0–46.0)
Hemoglobin: 11.8 g/dL — ABNORMAL LOW (ref 12.0–15.0)
Immature Granulocytes: 1 %
LYMPHS ABS: 1.1 10*3/uL (ref 0.7–4.0)
LYMPHS PCT: 10 %
MCH: 29.3 pg (ref 26.0–34.0)
MCHC: 33.7 g/dL (ref 30.0–36.0)
MCV: 86.8 fL (ref 80.0–100.0)
MONO ABS: 1.1 10*3/uL — AB (ref 0.1–1.0)
MONOS PCT: 10 %
Neutro Abs: 8.7 10*3/uL — ABNORMAL HIGH (ref 1.7–7.7)
Neutrophils Relative %: 79 %
PLATELETS: 27 10*3/uL — AB (ref 150–400)
RBC: 4.03 MIL/uL (ref 3.87–5.11)
RDW: 14.8 % (ref 11.5–15.5)
WBC: 11.1 10*3/uL — AB (ref 4.0–10.5)
nRBC: 0 % (ref 0.0–0.2)

## 2018-04-14 LAB — CBC
HEMATOCRIT: 31.9 % — AB (ref 36.0–46.0)
Hemoglobin: 10.9 g/dL — ABNORMAL LOW (ref 12.0–15.0)
MCH: 29.5 pg (ref 26.0–34.0)
MCHC: 34.2 g/dL (ref 30.0–36.0)
MCV: 86.4 fL (ref 80.0–100.0)
Platelets: 55 10*3/uL — ABNORMAL LOW (ref 150–400)
RBC: 3.69 MIL/uL — ABNORMAL LOW (ref 3.87–5.11)
RDW: 15.6 % — AB (ref 11.5–15.5)
WBC: 13 10*3/uL — AB (ref 4.0–10.5)
nRBC: 0.2 % (ref 0.0–0.2)

## 2018-04-14 LAB — CYTOLOGY - PAP
Diagnosis: UNDETERMINED — AB
HPV (WINDOPATH): NOT DETECTED

## 2018-04-14 LAB — BASIC METABOLIC PANEL
Anion gap: 10 (ref 5–15)
BUN: 27 mg/dL — AB (ref 8–23)
CO2: 21 mmol/L — ABNORMAL LOW (ref 22–32)
CREATININE: 1.91 mg/dL — AB (ref 0.44–1.00)
Calcium: 9 mg/dL (ref 8.9–10.3)
Chloride: 105 mmol/L (ref 98–111)
GFR calc Af Amer: 31 mL/min — ABNORMAL LOW (ref >60)
GFR calc non Af Amer: 27 mL/min — ABNORMAL LOW (ref >60)
Glucose, Bld: 121 mg/dL — ABNORMAL HIGH (ref 70–99)
Potassium: 4.3 mmol/L (ref 3.5–5.1)
SODIUM: 136 mmol/L (ref 135–145)

## 2018-04-14 LAB — PROTIME-INR
INR: 1.11
Prothrombin Time: 14.2 seconds (ref 11.4–15.2)

## 2018-04-14 LAB — DIC (DISSEMINATED INTRAVASCULAR COAGULATION) PANEL
APTT: 31 s (ref 24–36)
INR: 1.03
PLATELETS: 27 10*3/uL — AB (ref 150–400)

## 2018-04-14 LAB — TYPE AND SCREEN
ABO/RH(D): B POS
Antibody Screen: NEGATIVE

## 2018-04-14 LAB — DIC (DISSEMINATED INTRAVASCULAR COAGULATION)PANEL
D-Dimer, Quant: 2.27 ug/mL-FEU — ABNORMAL HIGH (ref 0.00–0.50)
Fibrinogen: 497 mg/dL — ABNORMAL HIGH (ref 210–475)
Prothrombin Time: 13.4 seconds (ref 11.4–15.2)

## 2018-04-14 LAB — ABO/RH: ABO/RH(D): B POS

## 2018-04-14 LAB — LACTATE DEHYDROGENASE: LDH: 982 U/L — ABNORMAL HIGH (ref 98–192)

## 2018-04-14 LAB — GLUCOSE, CAPILLARY: Glucose-Capillary: 119 mg/dL — ABNORMAL HIGH (ref 70–99)

## 2018-04-14 LAB — SAVE SMEAR (SSMR)

## 2018-04-14 LAB — HIV ANTIBODY (ROUTINE TESTING W REFLEX): HIV Screen 4th Generation wRfx: NONREACTIVE

## 2018-04-14 LAB — TROPONIN I: Troponin I: <0.03 ng/mL (ref <0.03)

## 2018-04-14 MED ORDER — SODIUM CHLORIDE 0.9% IV SOLUTION
Freq: Once | INTRAVENOUS | Status: AC
  Administered 2018-04-14: 04:00:00 via INTRAVENOUS

## 2018-04-14 MED ORDER — SODIUM CHLORIDE 0.9 % IV SOLN
INTRAVENOUS | Status: DC
  Administered 2018-04-14: 11:00:00 via INTRAVENOUS
  Administered 2018-04-15: 1000 mL via INTRAVENOUS
  Administered 2018-04-15 – 2018-04-16 (×3): via INTRAVENOUS

## 2018-04-14 MED ORDER — ONDANSETRON HCL 4 MG/2ML IJ SOLN
4.0000 mg | Freq: Four times a day (QID) | INTRAMUSCULAR | Status: DC | PRN
  Administered 2018-04-14 – 2018-04-16 (×5): 4 mg via INTRAVENOUS
  Filled 2018-04-14 (×5): qty 2

## 2018-04-14 MED ORDER — HEPARIN SODIUM (PORCINE) 1000 UNIT/ML IJ SOLN
2400.0000 [IU] | Freq: Once | INTRAMUSCULAR | Status: DC
  Filled 2018-04-14: qty 2.4

## 2018-04-14 MED ORDER — DEXAMETHASONE SODIUM PHOSPHATE 4 MG/ML IJ SOLN
4.0000 mg | Freq: Once | INTRAMUSCULAR | Status: AC
  Administered 2018-04-14: 4 mg via INTRAVENOUS
  Filled 2018-04-14: qty 1

## 2018-04-14 MED ORDER — LISINOPRIL 20 MG PO TABS: 40.0000 mg | ORAL_TABLET | Freq: Every day | ORAL | Status: DC

## 2018-04-14 MED ORDER — SODIUM CHLORIDE 0.9% FLUSH: 3.0000 mL | INTRAVENOUS | Status: DC | PRN

## 2018-04-14 MED ORDER — SODIUM CHLORIDE 0.9 % IV SOLN
2.0000 g | Freq: Once | INTRAVENOUS | Status: AC
  Administered 2018-04-14: 2 g via INTRAVENOUS
  Filled 2018-04-14: qty 20

## 2018-04-14 MED ORDER — ANTICOAGULANT SODIUM CITRATE 4% (200MG/5ML) IV SOLN
5.0000 mL | Freq: Once | Status: AC
  Administered 2018-04-14: 5 mL
  Filled 2018-04-14: qty 5

## 2018-04-14 MED ORDER — METOPROLOL TARTRATE 25 MG PO TABS
25.0000 mg | ORAL_TABLET | Freq: Two times a day (BID) | ORAL | Status: DC
  Administered 2018-04-15 – 2018-04-16 (×4): 25 mg via ORAL
  Filled 2018-04-14 (×4): qty 1

## 2018-04-14 MED ORDER — ACD FORMULA A 0.73-2.45-2.2 GM/100ML VI SOLN
500.0000 mL | Status: DC
  Filled 2018-04-14 (×2): qty 500

## 2018-04-14 MED ORDER — MORPHINE SULFATE (PF) 2 MG/ML IV SOLN
2.0000 mg | INTRAVENOUS | Status: DC | PRN
  Administered 2018-04-14 – 2018-04-15 (×2): 2 mg via INTRAVENOUS
  Filled 2018-04-14 (×2): qty 1

## 2018-04-14 MED ORDER — CALCIUM CARBONATE ANTACID 500 MG PO CHEW
2.0000 | CHEWABLE_TABLET | ORAL | Status: AC
  Administered 2018-04-14: 400 mg via ORAL
  Filled 2018-04-14 (×3): qty 2

## 2018-04-14 MED ORDER — SODIUM CHLORIDE 0.9% FLUSH
3.0000 mL | Freq: Two times a day (BID) | INTRAVENOUS | Status: DC
  Administered 2018-04-14 – 2018-04-16 (×5): 3 mL via INTRAVENOUS

## 2018-04-14 MED ORDER — SODIUM CHLORIDE 0.9 % IV SOLN
2.0000 g | Freq: Once | INTRAVENOUS | Status: AC
  Administered 2018-04-14: 2 g via INTRAVENOUS
  Filled 2018-04-14 (×3): qty 20

## 2018-04-14 MED ORDER — DIPHENHYDRAMINE HCL 25 MG PO CAPS
25.0000 mg | ORAL_CAPSULE | Freq: Four times a day (QID) | ORAL | Status: DC | PRN
  Administered 2018-04-14: 25 mg via ORAL
  Filled 2018-04-14: qty 1

## 2018-04-14 MED ORDER — SODIUM CHLORIDE 0.9 % IV SOLN: 250.0000 mL | INTRAVENOUS | Status: DC | PRN

## 2018-04-14 MED ORDER — ATORVASTATIN CALCIUM 40 MG PO TABS
40.0000 mg | ORAL_TABLET | Freq: Every day | ORAL | Status: DC
  Administered 2018-04-15: 40 mg via ORAL
  Filled 2018-04-14 (×2): qty 1

## 2018-04-14 MED ORDER — ACETAMINOPHEN 325 MG PO TABS
650.0000 mg | ORAL_TABLET | ORAL | Status: DC | PRN
  Administered 2018-04-14: 650 mg via ORAL
  Filled 2018-04-14: qty 2

## 2018-04-14 MED ORDER — IOHEXOL 300 MG/ML  SOLN
100.0000 mL | Freq: Once | INTRAMUSCULAR | Status: AC | PRN
  Administered 2018-04-14: 100 mL via INTRAVENOUS

## 2018-04-14 MED ORDER — SODIUM CHLORIDE 0.9 % IV SOLN
1.0000 g | INTRAVENOUS | Status: DC
  Administered 2018-04-15 – 2018-04-16 (×2): 1 g via INTRAVENOUS
  Filled 2018-04-14 (×3): qty 10

## 2018-04-14 MED ORDER — DIPHENHYDRAMINE HCL 25 MG PO CAPS
25.0000 mg | ORAL_CAPSULE | Freq: Once | ORAL | Status: AC
  Administered 2018-04-14: 25 mg via ORAL
  Filled 2018-04-14: qty 1

## 2018-04-14 MED ORDER — ONDANSETRON HCL 4 MG PO TABS: 4.0000 mg | ORAL_TABLET | Freq: Four times a day (QID) | ORAL | Status: DC | PRN

## 2018-04-14 MED ORDER — AMLODIPINE BESYLATE 5 MG PO TABS: 5.0000 mg | ORAL_TABLET | Freq: Every day | ORAL | Status: DC

## 2018-04-14 NOTE — Progress Notes (Signed)
[facility]                                                                              Patient Demographics  Kim Brady, is a 63 y.o. female, DOB - 1956/05/19, RUE:[id number]  Admit date - April 21, 2018   Admitting Physician Haydee Monica, MD  Outpatient Primary MD for the patient is Loletta Specter, PA-C  Outpatient specialists:   LOS - 0  days   Medical records reviewed and are as summarized below:    Chief Complaint  Patient presents with  . Chest Pain       Brief summary   Patient is a 62 year old female with CKD stage II, hypertension, CAD presented with nausea, vomiting, upper abdominal pain with flank pain.  Patient presented to the ED, where she was found to have gross hematuria.  Hemoglobin was 12.6, platelets 37,000, recent CBC on 2/22 had shown platelet count of 341K. CT abdomen showed bilateral perirenal fat edema suggestive of pyelonephritis.  No hydronephrosis.  Due to concern for acute TTP and pyelonephritis, patient was admitted for further work-up.   Assessment & Plan    Principal Problem: Acute TTP (thrombotic thrombocytopenic purpura) (HCC) -Unclear etiology, possibly due to UTI and pyelonephritis.  New severe thrombocytopenia with schistocytes, LDH.  INR 1.0, normal APTT. -Hematology, Dr. Darnelle Catalan was consulted recommended plasmapheresis until platelets recover.  CCM placed HD catheter, plan for plasma exchange today -Follow CBC, renal panel daily  Active Problems: Acute pyelonephritis with UTI -Follow urine culture, blood cultures, continue IV Rocephin    Essential hypertension, benign -BP currently soft, with intractable nausea and vomiting, hold off on lisinopril and Norvasc, continue beta-blocker only  Intractable nausea and vomiting -Likely due to #1 and pyelonephritis, placed on IV fluid hydration, Zofran IV as needed -Will place on clear liquid diet once first plasma exchange is completed   Code Status: Full CODE  STATUS DVT Prophylaxis: SCDs Family Communication: Discussed in detail with the patient, all imaging results, lab results explained to the patient and nephew at the bedside   Disposition Plan: High risk of deterioration with acute TTP and polynephritis, will remain in stepdown  Time Spent in minutes   35 minutes  Procedures:  HD catheter insertion on 11/21  Consultants:   Heme oncology CCM  Antimicrobials:   IV Rocephin 11/20>>   Medications  Scheduled Meds: . atorvastatin  40 mg Oral Daily  . calcium carbonate  2 tablet Oral Q3H  . heparin  2,400 Units Intravenous Once  . metoprolol tartrate  25 mg Oral BID  . sodium chloride flush  3 mL Intravenous Q12H   Continuous Infusions: . sodium chloride    . sodium chloride    . anticoagulant sodium citrate    . calcium gluconate IVPB    . [START ON 04/15/2018] cefTRIAXone (ROCEPHIN)  IV    . citrate dextrose     PRN Meds:.sodium chloride, acetaminophen, diphenhydrAMINE, morphine injection, ondansetron **OR** ondansetron (ZOFRAN) IV, sodium chloride flush   Antibiotics   Anti-infectives (From admission, onward)   Start     Dose/Rate Route Frequency Ordered Stop   04/15/18 0500  cefTRIAXone (  ROCEPHIN) 1 g in sodium chloride 0.9 % 100 mL IVPB     1 g 200 mL/hr over 30 Minutes Intravenous Every 24 hours 04/14/18 1009     04/14/18 0530  cefTRIAXone (ROCEPHIN) 2 g in sodium chloride 0.9 % 100 mL IVPB     2 g 200 mL/hr over 30 Minutes Intravenous  Once 04/14/18 0520 04/14/18 [id number]        Subjective:   Kim Brady was seen and examined today.  Having multiple episodes of vomiting, bilious, no hematemesis.  Also complaining of lower abdominal pain with flank pain, 6/10, with nausea.  Currently no chest pain or shortness of breath, fevers or chills.   Objective:   Vitals:   04/14/18 0715 04/14/18 0752 04/14/18 0754 04/14/18 0800  BP: 126/75   121/86  Pulse: 86   84  Resp: 15   18  Temp:  98.9 F (37.2 C)      TempSrc:  Oral    SpO2: 99%   98%  Weight:   69.2 kg   Height:   5' (1.524 m)     Intake/Output Summary (Last 24 hours) at 04/14/2018 1028 Last data filed at 04/14/2018 0534 Gross per 24 hour  Intake 2894 ml  Output -  Net 2894 ml     Wt Readings from Last 3 Encounters:  04/14/18 69.2 kg  04/12/18 69.4 kg  01/07/18 68.8 kg     Exam  General: Alert and oriented x 3, NAD, uncomfortable, ill-appearing  Eyes:   HEENT:  Atraumatic, normocephalic  Cardiovascular: S1 S2 clear, RRR  Respiratory: Clear to auscultation bilaterally, no wheezing, rales or rhonchi  Gastrointestinal: Soft, mild diffuse tenderness, nondistended, NBS  Ext: no pedal edema bilaterally  Neuro: No new deficits  Musculoskeletal: No digital cyanosis, clubbing  Skin: No rashes  Psych: Normal affect and demeanor, alert and oriented x3    Data Reviewed:  I have personally reviewed following labs and imaging studies  Micro Results No results found for this or any previous visit (from the past 240 hour(s)).  Radiology Reports Ct Abdomen Pelvis W Contrast  Result Date: 04/14/2018 CLINICAL DATA:  Acute onset right upper quadrant pain and nausea. Elevated bilirubin. EXAM: CT ABDOMEN AND PELVIS WITH CONTRAST TECHNIQUE: Multidetector CT imaging of the abdomen and pelvis was performed using the standard protocol following bolus administration of intravenous contrast. CONTRAST:  100mL OMNIPAQUE IOHEXOL 300 MG/ML  SOLN COMPARISON:  None. FINDINGS: Lower chest: Dependent atelectasis in the lung bases. Coronary artery calcifications. Hepatobiliary: Mild diffuse fatty infiltration of the liver. No focal liver lesions. Gallbladder is distended without wall thickening, edema, or stone. Bile ducts are not dilated. Pancreas: Unremarkable. No pancreatic ductal dilatation or surrounding inflammatory changes. Spleen: Normal in size without focal abnormality. Adrenals/Urinary Tract: No adrenal gland nodules. Renal  nephrograms are symmetrical. Subcentimeter cyst on the left kidney. No hydronephrosis or hydroureter. There is edema in the pararenal fat bilaterally. This may indicate pyelonephritis or inflammatory condition. Bladder wall is not thickened and no bladder filling defects are present. Stomach/Bowel: Stomach, small bowel, and colon are not abnormally distended. No wall thickening or inflammatory changes. Appendix is normal. Vascular/Lymphatic: Aortic atherosclerosis. No enlarged abdominal or pelvic lymph nodes. Reproductive: Uterus and bilateral adnexa are unremarkable. Other: No abdominal wall hernia or abnormality. No abdominopelvic ascites. Musculoskeletal: Degenerative changes in the spine. No destructive bone lesions. IMPRESSION: 1. Nonspecific edema in the pararenal fat bilaterally. This could indicate pyelonephritis or inflammatory condition. No hydronephrosis or hydroureter. 2. Mild diffuse  fatty infiltration of the liver. 3. Distended gallbladder without wall thickening or stone. Aortic Atherosclerosis (ICD10-I70.0). Electronically Signed   By: Burman Nieves M.D.   On: 04/14/2018 01:01   Dg Chest Portable 1 View  Result Date: 04/14/2018 CLINICAL DATA:  Central line placement EXAM: PORTABLE CHEST 1 VIEW COMPARISON:  04-29-2018 FINDINGS: Right central venous catheter placed with tip over the low SVC region. No pneumothorax. Shallow inspiration with mild atelectasis in the right lung base. Heart size and pulmonary vascularity are normal for technique. No consolidation or airspace disease in the lungs. No blunting of costophrenic angles. No pneumothorax. Mediastinal contours appear intact. IMPRESSION: Right central venous catheter placed with tip over the low SVC region. No pneumothorax. Shallow inspiration with atelectasis in the right lung base. Electronically Signed   By: Burman Nieves M.D.   On: 04/14/2018 06:36   Dg Abdomen Acute W/chest  Result Date: 04/29/18 CLINICAL DATA:  Shortness of  breath.  Dizziness. EXAM: DG ABDOMEN ACUTE W/ 1V CHEST COMPARISON:  None. FINDINGS: There is no evidence of dilated bowel loops or free intraperitoneal air. No radiopaque calculi or other significant radiographic abnormality is seen. Heart size and mediastinal contours are within normal limits. Both lungs are clear. IMPRESSION: Negative abdominal radiographs.  No acute cardiopulmonary disease. Electronically Signed   By: Gerome Sam III M.D   On: 2018-04-29 21:32   Ms Digital Screening Tomo Bilateral  Result Date: 04/29/2018 CLINICAL DATA:  Screening. EXAM: DIGITAL SCREENING BILATERAL MAMMOGRAM WITH TOMO AND CAD COMPARISON:  Previous exam(s). ACR Breast Density Category c: The breast tissue is heterogeneously dense, which may obscure small masses. FINDINGS: There are no findings suspicious for malignancy. Images were processed with CAD. IMPRESSION: No mammographic evidence of malignancy. A result letter of this screening mammogram will be mailed directly to the patient. RECOMMENDATION: Screening mammogram in one year. (Code:SM-B-01Y) BI-RADS CATEGORY  1: Negative. Electronically Signed   By: Ted Mcalpine M.D.   On: 04/29/2018 16:01    Lab Data:  CBC: Recent Labs  Lab 04/29/18 2014 04-29-18 2357 04/14/18 0636  WBC 6.6 11.1* 13.0*  NEUTROABS  --  8.7*  --   HGB 12.6 11.8* 10.9*  HCT 37.7 35.0* 31.9*  MCV 86.7 86.8 86.4  PLT 37* 27*  27* 55*   Basic Metabolic Panel: Recent Labs  Lab 04-29-2018 2014 04/14/18 0636  NA 137 136  K 3.1* 4.3  CL 107 105  CO2 19* 21*  GLUCOSE 110* 121*  BUN 13 27*  CREATININE 0.93 1.91*  CALCIUM 9.6 9.0   GFR: Estimated Creatinine Clearance: 25.8 mL/min (A) (by C-G formula based on SCr of 1.91 mg/dL (H)). Liver Function Tests: Recent Labs  Lab 04/29/2018 2014  AST 23  ALT 17  ALKPHOS 66  BILITOT 4.0*  PROT 8.1  ALBUMIN 4.3   Recent Labs  Lab April 29, 2018 2014  LIPASE 52*   No results for input(s): AMMONIA in the last 168  hours. Coagulation Profile: Recent Labs  Lab 04/29/18 2357  INR 1.03  1.11   Cardiac Enzymes: Recent Labs  Lab Apr 29, 2018 2357  TROPONINI <0.03   BNP (last 3 results) No results for input(s): PROBNP in the last 8760 hours. HbA1C: No results for input(s): HGBA1C in the last 72 hours. CBG: Recent Labs  Lab 04/14/18 0750  GLUCAP 119*   Lipid Profile: No results for input(s): CHOL, HDL, LDLCALC, TRIG, CHOLHDL, LDLDIRECT in the last 72 hours. Thyroid Function Tests: No results for input(s): TSH, T4TOTAL, FREET4, T3FREE, THYROIDAB  in the last 72 hours. Anemia Panel: No results for input(s): VITAMINB12, FOLATE, FERRITIN, TIBC, IRON, RETICCTPCT in the last 72 hours. Urine analysis:    Component Value Date/Time   COLORURINE STRAW (A) 04/14/2018 0144   APPEARANCEUR CLOUDY (A) 04/14/2018 0144   LABSPEC 1.023 04/14/2018 0144   PHURINE 7.0 04/14/2018 0144   GLUCOSEU 50 (A) 04/14/2018 0144   HGBUR LARGE (A) 04/14/2018 0144   BILIRUBINUR NEGATIVE 04/14/2018 0144   KETONESUR NEGATIVE 04/14/2018 0144   PROTEINUR >=300 (A) 04/14/2018 0144   UROBILINOGEN 0.2 08/06/2009 1709   NITRITE NEGATIVE 04/14/2018 0144   LEUKOCYTESUR NEGATIVE 04/14/2018 0144     Chavy Avera M.D. [facility] 04/14/2018, 10:28 AM  Pager: [phone] Between 7am to 7pm - call Pager - [phone]  After 7pm go to [email or web address] - password [email or web address]  Call night coverage person covering after 7pm

## 2018-04-14 NOTE — H&P (Addendum)
History and Physical    [name] ZOX:[id number] DOB: January 23, 1956 DOA: 03/17/18  PCP: [name], Roger Felma Pfefferle, PA-C  Patient coming from: Home  Chief Complaint: Nausea pain in the uppeCannon Brady abdomen and the left flank  HPI: Kim KettleVanessa L Brady is a 62 y.o. female with medical history significant of chronic kidney disease hypertension coronary artery disease comes in with 1 day of a lot of nausea some vomiting with associated upper abdominal pain with flank pain.  Patient reports no dysuria.  She does report hematuria she denies any other bleeding.  She denies any rashes.  Patient referred for admission for new findings of elevated total bili with a platelet count of 27 schistocytes on her smear concerning for TTP with possible underlying infection.  Hematologist on-call has been called who is considering plasmapheresis.  Review of Systems: As per HPI otherwise 10 point review of systems negative.   Past Medical History:  Diagnosis Date  . Chronic kidney disease   . Coronary artery disease   . Ejection fraction   . Essential hypertension Dx March 2016  . GERD (gastroesophageal reflux disease)   . NSTEMI (non-ST elevated myocardial infarction) (HCC) 08/10/2014   Inferior, with drug-eluting stent to the RCA    Past Surgical History:  Procedure Laterality Date  . CORONARY STENT PLACEMENT    . LEFT HEART CATHETERIZATION WITH CORONARY ANGIOGRAM N/A 08/13/2014   Procedure: LEFT HEART CATHETERIZATION WITH CORONARY ANGIOGRAM;  Surgeon: Kathleene Hazelhristopher D McAlhany, MD;  [facility];  Service: Cardiovascular;  Laterality: N/A;     reports that she has been smoking cigarettes. She has been smoking about 0.50 packs per day. She has never used smokeless tobacco. She reports that she drinks alcohol. She reports that she does not use drugs.  No Known Allergies  Family History  Problem Relation Age of Onset  . Heart attack Mother   . Hypertension Mother   . Heart attack Father   . Lupus Sister    . Hypertension Maternal Uncle   . Stroke Neg Hx     Prior to Admission medications   Medication Sig Start Date End Date Taking? Authorizing Provider  acetaminophen (TYLENOL) 500 MG tablet Take 1,000 mg by mouth every 6 (six) hours as needed for mild pain.   Yes [provider]  amLODipine (NORVASC) 5 MG tablet Take 1 tablet (5 mg total) by mouth daily. 12/09/17  Yes [name], Roger Micole Delehanty, PA-C  aspirin 81 MG EC tablet Take 1 tablet (81 mg total) by mouth daily. 10/26/14  Yes Funches, Josalyn, MD  atorvastatin (LIPITOR) 40 MG tablet Take 1 tablet (40 mg total) by mouth daily. 07/16/17  Yes [name], Roger Kristyl Athens, PA-C  bismuth subsalicylate (PEPTO BISMOL) 262 MG/15ML suspension Take 30 mLs by mouth every 6 (six) hours as needed for indigestion.   Yes [provider]  lisinopril (PRINIVIL,ZESTRIL) 40 MG tablet Take 1 tablet (40 mg total) by mouth daily. 02/23/18  Yes Jake BatheSkains, Mark C, MD  metoprolol tartrate (LOPRESSOR) 25 MG tablet Take 1 tablet (25 mg total) by mouth 2 (two) times daily. 07/16/17  Yes [name], Roger Crystalee Ventress, PA-C  nitroGLYCERIN (NITROSTAT) 0.4 MG SL tablet Place 1 tablet (0.4 mg total) under the tongue every 5 (five) minutes as needed for chest pain. 07/16/17  Yes [name], Roger Analeise Mccleery, PA-C  ranitidine (ZANTAC) 150 MG tablet TAKE 1 TABLET BY MOUTH 2 TIMES DAILY. 01/11/18  Yes [name], Roger Evaleigh Mccamy, PA-C    Physical Exam: Vitals:   04/14/18 0345 04/14/18 [id number]  04/14/18 0400 04/14/18 0415  BP: (!) 116/91 119/81 120/82 128/88  Pulse: 90 88 89 88  Resp: 18 16 17 18   Temp:  99 F (37.2 C)    TempSrc:  Oral    SpO2: 99% 98% 97% 100%      Constitutional: NAD, calm, comfortable Vitals:   04/14/18 0345 04/14/18 0359 04/14/18 0400 04/14/18 0415  BP: (!) 116/91 119/81 120/82 128/88  Pulse: 90 88 89 88  Resp: 18 16 17 18   Temp:  99 F (37.2 C)    TempSrc:  Oral    SpO2: 99% 98% 97% 100%   Eyes: PERRL, lids and conjunctivae normal ENMT: Mucous membranes are moist. Posterior  pharynx clear of any exudate or lesions.Normal dentition.  Neck: normal, supple, no masses, no thyromegaly Respiratory: clear to auscultation bilaterally, no wheezing, no crackles. Normal respiratory effort. No accessory muscle use.  Cardiovascular: Regular rate and rhythm, no murmurs / rubs / gallops. No extremity edema. 2+ pedal pulses. No carotid bruits.  Abdomen: no tenderness, no masses palpated. No hepatosplenomegaly. Bowel sounds positive.  Musculoskeletal: no clubbing / cyanosis. No joint deformity upper and lower extremities. Good ROM, no contractures. Normal muscle tone.  Skin: no rashes, lesions, ulcers. No induration Neurologic: CN 2-12 grossly intact. Sensation intact, DTR normal. Strength 5/5 in all 4.  Psychiatric: Normal judgment and insight. Alert and oriented x 3. Normal mood.    Labs on Admission: I have personally reviewed following labs and imaging studies  CBC: Recent Labs  Lab 03/26/2018 2014 04/08/2018 2357  WBC 6.6 11.1*  NEUTROABS  --  8.7*  HGB 12.6 11.8*  HCT 37.7 35.0*  MCV 86.7 86.8  PLT 37* 27*  27*   Basic Metabolic Panel: Recent Labs  Lab 04/15/2018 2014  NA 137  K 3.1*  CL 107  CO2 19*  GLUCOSE 110*  BUN 13  CREATININE 0.93  CALCIUM 9.6   GFR: Estimated Creatinine Clearance: 53.2 mL/min (by C-G formula based on SCr of 0.93 mg/dL). Liver Function Tests: Recent Labs  Lab 04/07/2018 2014  AST 23  ALT 17  ALKPHOS 66  BILITOT 4.0*  PROT 8.1  ALBUMIN 4.3   Recent Labs  Lab 04/10/2018 2014  LIPASE 52*   No results for input(s): AMMONIA in the last 168 hours. Coagulation Profile: Recent Labs  Lab 04/16/2018 2357  INR 1.03  1.11   Cardiac Enzymes: Recent Labs  Lab 04/16/2018 2357  TROPONINI <0.03   BNP (last 3 results) No results for input(s): PROBNP in the last 8760 hours. HbA1C: No results for input(s): HGBA1C in the last 72 hours. CBG: No results for input(s): GLUCAP in the last 168 hours. Lipid Profile: No results for  input(s): CHOL, HDL, LDLCALC, TRIG, CHOLHDL, LDLDIRECT in the last 72 hours. Thyroid Function Tests: No results for input(s): TSH, T4TOTAL, FREET4, T3FREE, THYROIDAB in the last 72 hours. Anemia Panel: No results for input(s): VITAMINB12, FOLATE, FERRITIN, TIBC, IRON, RETICCTPCT in the last 72 hours. Urine analysis:    Component Value Date/Time   COLORURINE STRAW (A) 04/14/2018 0144   APPEARANCEUR CLOUDY (A) 04/14/2018 0144   LABSPEC 1.023 04/14/2018 0144   PHURINE 7.0 04/14/2018 0144   GLUCOSEU 50 (A) 04/14/2018 0144   HGBUR LARGE (A) 04/14/2018 0144   BILIRUBINUR NEGATIVE 04/14/2018 0144   KETONESUR NEGATIVE 04/14/2018 0144   PROTEINUR >=300 (A) 04/14/2018 0144   UROBILINOGEN 0.2 08/06/2009 1709   NITRITE NEGATIVE 04/14/2018 0144   LEUKOCYTESUR NEGATIVE 04/14/2018 0144   Sepsis Labs: !!!!!!!!!!!!!!!!!!!!!!!!!!!!!!!!!!!!!!!!!!!! @  LABRCNTIP(procalcitonin:4,lacticidven:4) )No results found for this or any previous visit (from the past 240 hour(s)).   Radiological Exams on Admission: Ct Abdomen Pelvis W Contrast  Result Date: 04/14/2018 CLINICAL DATA:  Acute onset right upper quadrant pain and nausea. Elevated bilirubin. EXAM: CT ABDOMEN AND PELVIS WITH CONTRAST TECHNIQUE: Multidetector CT imaging of the abdomen and pelvis was performed using the standard protocol following bolus administration of intravenous contrast. CONTRAST:  OMNIPAQUE IOHEXOL 300 MG/ML  SOLN COMPARISON:  None. FINDINGS: Lower chest: Dependent atelectasis in the lung bases. Coronary artery calcifications. Hepatobiliary: Mild diffuse fatty infiltration of the liver. No focal liver lesions. Gallbladder is distended without wall thickening, edema, or stone. Bile ducts are not dilated. Pancreas: Unremarkable. No pancreatic ductal dilatation or surrounding inflammatory changes. Spleen: Normal in size without focal abnormality. Adrenals/Urinary Tract: No adrenal gland nodules. Renal nephrograms are symmetrical.  Subcentimeter cyst on the left kidney. No hydronephrosis or hydroureter. There is edema in the pararenal fat bilaterally. This may indicate pyelonephritis or inflammatory condition. Bladder wall is not thickened and no bladder filling defects are present. Stomach/Bowel: Stomach, small bowel, and colon are not abnormally distended. No wall thickening or inflammatory changes. Appendix is normal. Vascular/Lymphatic: Aortic atherosclerosis. No enlarged abdominal or pelvic lymph nodes. Reproductive: Uterus and bilateral adnexa are unremarkable. Other: No abdominal wall hernia or abnormality. No abdominopelvic ascites. Musculoskeletal: Degenerative changes in the spine. No destructive bone lesions. IMPRESSION: 1. Nonspecific edema in the pararenal fat bilaterally. This could indicate pyelonephritis or inflammatory condition. No hydronephrosis or hydroureter. 2. Mild diffuse fatty infiltration of the liver. 3. Distended gallbladder without wall thickening or stone. Aortic Atherosclerosis (ICD10-I70.0). Electronically Signed   By: Burman Nieves M.D.   On: 04/14/2018 01:01   Dg Abdomen Acute W/chest  Result Date: 05-08-18 CLINICAL DATA:  Shortness of breath.  Dizziness. EXAM: DG ABDOMEN ACUTE W/ 1V CHEST COMPARISON:  None. FINDINGS: There is no evidence of dilated bowel loops or free intraperitoneal air. No radiopaque calculi or other significant radiographic abnormality is seen. Heart size and mediastinal contours are within normal limits. Both lungs are clear. IMPRESSION: Negative abdominal radiographs.  No acute cardiopulmonary disease. Electronically Signed   By: Gerome Sam III M.D   On: 2018-05-08 21:32   Ms Digital Screening Tomo Bilateral  Result Date: 08-May-2018 CLINICAL DATA:  Screening. EXAM: DIGITAL SCREENING BILATERAL MAMMOGRAM WITH TOMO AND CAD COMPARISON:  Previous exam(s). ACR Breast Density Category c: The breast tissue is heterogeneously dense, which may obscure small masses. FINDINGS:  There are no findings suspicious for malignancy. Images were processed with CAD. IMPRESSION: No mammographic evidence of malignancy. A result letter of this screening mammogram will be mailed directly to the patient. RECOMMENDATION: Screening mammogram in one year. (Code:SM-B-01Y) BI-RADS CATEGORY  1: Negative. Electronically Signed   By: Ted Mcalpine M.D.   On: 05/08/2018 16:01   Old chart reviewed Case discussed with Dr. 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  She is currently receiving a platelet transfusion for placement of a Vas-Cath for likely plasmapheresis in the near future.  Follow-up on their recommendations.  Keep n.p.o. at this time.  Will place in stepdown unit.  Active Problems:   Essential hypertension, benign-clarify home meds and resume    Acute pyelonephritis-we will hold off on any antibiotics at this time in the setting of TTP as  above.  Urine culture has been sent off.  Patient stable and not toxic or septic so think it safe to hold off for a couple hours for antibiotics until seen by hematology.     DVT prophylaxis: Ambulate Code Status: Full Family Communication: None Disposition Plan: Days Consults called: Hematology Admission status: Admission   Rondarius Kadrmas A MD [facility]  If 7PM-7AM, please contact night-coverage [email or web address] Password [facility]  04/14/2018, 4:41 AM   Antibiotics Rocephin recommended by Dr. Darnelle Catalan ordered by Dr. Erma Heritage in the ED.

## 2018-04-14 NOTE — Procedures (Signed)
Hemodialysis Catheter Insertion Procedure Note Kim Brady [id number] 1955-11-10  Procedure: Insertion of Hemodialysis Catheter Indications: Plasmapharesis  Procedure Details Consent: Risks of procedure as well as the alternatives and risks of each were explained to the (patient/caregiver).  Consent for procedure obtained. Time Out: Verified patient identification, verified procedure, site/side was marked, verified correct patient position, special equipment/implants available, medications/allergies/relevent history reviewed, required imaging and test results available.  Performed  Maximum sterile technique was used including antiseptics, cap, gloves, gown, hand hygiene, mask and sheet. Skin prep: Chlorhexidine; local anesthetic administered A antimicrobial bonded/coated triple lumen catheter was placed in the right internal jugular vein using the Seldinger technique.  Evaluation Blood flow good Complications: No apparent complications Patient did tolerate procedure well. Chest X-ray ordered to verify placement.  CXR: normal.  Procedure performed under direct ultrasound guidance for real time vessel cannulation.      Rutherford Guysahul Desai, [place] - [facility] Pgr: [phone]  or [phone] 04/14/2018, 6:39 AM

## 2018-04-14 NOTE — Consult Note (Signed)
[place]  [phone] [phone]     ID: Kim Brady DOB: [birth year]  MR#: [id number]  WER#:[id number]  Patient Care Team: Kim Brady as PCP - General (Physician Assistant) Kim Cruel, MD OTHER MD:  CHIEF COMPLAINT: TTP  CURRENT TREATMENT: therapeutic plasma exchange   HISTORY OF CURRENT ILLNESS: The patient tells me she was generally well until she had her Pap smear at the cancer center 04/12/2018.  This is Brady free Pap smear screening program.  Exam per the 04/12/2018 note was unremarkable.  After going home the patient had some slight vaginal bleeding which she felt was likely due to the procedure.  On 04/21/2018 at around 7 PM however she started feeling chest and abdominal pain.  This localized to the area under her left breast and vaguely to the lower abdomen.  It brought her to the emergency room where she was found to have significant gross hematuria.  Basic lab work showed Brady white cell count of 6.6, hemoglobin 12.6, MCV 86.7 and platelets 37,000.  Note the most recent CBC I have available from 07/16/2017 showed Brady platelet count of 341.  KUB was negative but CT of the abdomen and pelvis with contrast showed bilateral pararenal fat edema, suggestive of pyelonephritis or other inflammatory condition.  The bladder wall was not thickened, there was no bladder filling defect, and there was no hydronephrosis or hydroureter.  The patient was pancultured and we were consulted.  DIC panel was obtained which showed normal coags but schistocytes.  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  The patient's subsequent history is as detailed below.  INTERVAL HISTORY: I met with the patient in her emergency department room the morning of 04/14/2018  REVIEW OF SYSTEMS: The patient tells me her neck is stiff, status post recent line placement.  She says the chest and abdominal pain is "better".  She was not aware of gross hematuria before  coming to the emergency room.  She denies any other overt bleeding including melena, bright red blood per rectum, hemoptysis, or significant bruising.  She is not aware of having Brady fever, but does not have Brady thermometer.  She has some back pain which is not Brady new problem.  There have been no unusual headaches, visual changes, nausea, vomiting, dizziness, gait imbalance, or confusion.  She denies falls.  There has been no diarrhea.  Brady detailed review of systems today was otherwise noncontributory  PAST MEDICAL HISTORY: Past Medical History:  Diagnosis Date  . Chronic kidney disease   . Coronary artery disease   . Ejection fraction   . Essential hypertension Dx March 2016  . GERD (gastroesophageal reflux disease)   . NSTEMI (non-ST elevated myocardial infarction) ([place]) 08/10/2014   Inferior, with drug-eluting stent to the RCA    PAST SURGICAL HISTORY: Past Surgical History:  Procedure Laterality Date  . CORONARY STENT PLACEMENT    . LEFT HEART CATHETERIZATION WITH CORONARY ANGIOGRAM N/Brady 08/13/2014   Procedure: LEFT HEART CATHETERIZATION WITH CORONARY ANGIOGRAM;  Surgeon: Kim Blanks, MD;  Location: [facility] CATH LAB;  Service: Cardiovascular;  Laterality: N/Brady;    FAMILY HISTORY Family History  Problem Relation Age of Onset  . Heart attack Mother   . Hypertension Mother   . Heart attack Father   . Lupus Sister   . Hypertension Maternal Uncle   . Stroke Neg Hx   The patient's father died from Brady myocardial infarction at age 15.  The patient's mother is  27 years old as of November 2019.  The patient has 1 brother, and 2 sisters.  One sister has systemic lupus.  GYNECOLOGIC HISTORY:  No LMP recorded. Patient is postmenopausal. Menarche: 62 years old [name] Brady 0 LMP 1994 Hysterectomy?  No Salpingo-oophorectomy?  No    SOCIAL HISTORY:  The patient tells me that she is receiving Social Security and is not working.  She lives with her mother Kim Brady, and her sister  Kim Brady.     ADVANCED DIRECTIVES: Not in place   HEALTH MAINTENANCE: Social History   Tobacco Use  . Smoking status: Current Some Day Smoker    Packs/day: 0.50    Types: Cigarettes    Last attempt to quit: 08/16/2014    Years since quitting: 3.6  . Smokeless tobacco: Never Used  Substance Use Topics  . Alcohol use: Yes    Alcohol/week: 0.0 standard drinks  . Drug use: No     No Known Allergies  Current Facility-Administered Medications  Medication Dose Route Frequency Provider Last Rate Last Dose  . 0.9 %  sodium chloride infusion  250 mL Intravenous PRN Kim Grout, MD      . acetaminophen (TYLENOL) tablet 650 mg  650 mg Oral Q4H PRN Kim Brady, Kim Dad, MD      . amLODipine (NORVASC) tablet 5 mg  5 mg Oral Daily Kim Kay A, MD      . anticoagulant sodium citrate solution 5 mL  5 mL Intracatheter Once Kim Brady, Kim Dad, MD      . atorvastatin (LIPITOR) tablet 40 mg  40 mg Oral Daily Kim Kay A, MD      . calcium carbonate (TUMS - dosed in mg elemental calcium) chewable tablet 400 mg of elemental calcium  2 tablet Oral Q3H Kim Brady, Kim Dad, MD      . calcium gluconate 2 g in sodium chloride 0.9 % 250 mL IVPB  2 g Intravenous Once Kim Brady, Kim Dad, MD      . citrate dextrose (ACD-Brady anticoagulant) solution 500 mL  500 mL Intravenous Continuous Kim Brady, Kim Dad, MD      . diphenhydrAMINE (BENADRYL) capsule 25 mg  25 mg Oral Q6H PRN Kim Brady, Kim Dad, MD      . heparin injection 2,400 Units  2,400 Units Intravenous Once Kim Brady, Kim P, PA-C      . lisinopril (PRINIVIL,ZESTRIL) tablet 40 mg  40 mg Oral Daily Kim Kay A, MD      . metoprolol tartrate (LOPRESSOR) tablet 25 mg  25 mg Oral BID Kim Kay A, MD      . ondansetron (ZOFRAN) tablet 4 mg  4 mg Oral Q6H PRN Kim Grout, MD       Or  . ondansetron (ZOFRAN) injection 4 mg  4 mg Intravenous Q6H PRN Kim Kay A, MD      . sodium chloride flush (NS) 0.9 % injection 3 mL  3 mL Intravenous Q12H  Kim Brady, Kim A, MD      . sodium chloride flush (NS) 0.9 % injection 3 mL  3 mL Intravenous PRN Kim Grout, MD        OBJECTIVE: Middle-aged African-American woman examined in bed  Vitals:   04/14/18 0700 04/14/18 0715  BP: 125/82 126/75  Pulse: 85 86  Resp: 17 15  Temp:    SpO2: 98% 99%     There is no height or weight on file to calculate BMI.   Wt Readings from Last 3 Encounters:  04/12/18  153 lb (69.4 kg)  01/07/18 151 lb 9.6 oz (68.8 kg)  12/21/17 151 lb 12.8 oz (68.9 kg)     Ear-nose-throat: Poor dentition Lungs no rales or rhonchi, auscultated anterolaterally Heart regular rate and rhythm Abd soft, nontender, positive bowel sounds MSK Central line right neck, intact Neuro: non-focal, well-oriented, appropriate affect Breasts: Deferred   LAB RESULTS:  CMP     Component Value Date/Time   NA 136 04/14/2018 0636   NA 140 01/07/2018 1022   K 4.3 04/14/2018 0636   CL 105 04/14/2018 0636   CO2 21 (L) 04/14/2018 0636   GLUCOSE 121 (H) 04/14/2018 0636   BUN 27 (H) 04/14/2018 0636   BUN 11 01/07/2018 1022   CREATININE 1.91 (H) 04/14/2018 0636   CALCIUM 9.0 04/14/2018 0636   PROT 8.1 04/20/2018 2014   PROT 7.5 07/16/2017 1125   ALBUMIN 4.3 04/22/2018 2014   ALBUMIN 4.8 07/16/2017 1125   AST 23 03/26/2018 2014   ALT 17 03/28/2018 2014   ALKPHOS 66 03/30/2018 2014   BILITOT 4.0 (H) 03/27/2018 2014   BILITOT 1.1 07/16/2017 1125   GFRNONAA 27 (L) 04/14/2018 0636   GFRAA 31 (L) 04/14/2018 0636    No results found for: TOTALPROTELP, ALBUMINELP, A1GS, A2GS, BETS, BETA2SER, GAMS, MSPIKE, SPEI  No results found for: KPAFRELGTCHN, LAMBDASER, KAPLAMBRATIO  Lab Results  Component Value Date   WBC 11.1 (H) 03/30/2018   NEUTROABS 8.7 (H) 04/10/2018   HGB 11.8 (L) 04/08/2018   HCT 35.0 (L) 04/09/2018   MCV 86.8 04/19/2018   PLT 27 (LL) 04/12/2018   PLT 27 (LL) 04/15/2018    @LASTCHEMISTRY @  No results found for: LABCA2  No components found for:  [id number]  Recent Labs  Lab 04/23/2018 2357  INR 1.03  1.11    No results found for: LABCA2  No results found for: VCB449  No results found for: QPR916  No results found for: BWG665  No results found for: CA2729  No components found for: HGQUANT  No results found for: CEA1 / No results found for: CEA1   No results found for: AFPTUMOR  No results found for: CHROMOGRNA  No results found for: PSA1  Admission on 03/30/2018  Component Date Value Ref Range Status  . Sodium 04/12/2018 137  135 - 145 mmol/L Final  . Potassium 04/18/2018 3.1* 3.5 - 5.1 mmol/L Final  . Chloride 04/18/2018 107  98 - 111 mmol/L Final  . CO2 04/04/2018 19* 22 - 32 mmol/L Final  . Glucose, Bld 04/14/2018 110* 70 - 99 mg/dL Final  . BUN 04/12/2018 13  8 - 23 mg/dL Final  . Creatinine, Ser 04/12/2018 0.93  0.44 - 1.00 mg/dL Final  . Calcium 04/21/2018 9.6  8.9 - 10.3 mg/dL Final  . GFR calc non Af Amer 04/20/2018 >60  >60 mL/min Final  . GFR calc Af Amer 03/27/2018 >60  >60 mL/min Final   Comment: (NOTE) The eGFR has been calculated using the CKD EPI equation. This calculation has not been validated in all clinical situations. eGFR's persistently <60 mL/min signify possible Chronic Kidney Disease.   Georgiann Hahn gap 04/08/2018 11  5 - 15 Final   Performed at [facility], [place] [address] [place], [place] [address]  . WBC 03/26/2018 6.6  4.0 - 10.5 K/uL Final  . RBC 04/06/2018 4.35  3.87 - 5.11 MIL/uL Final  . Hemoglobin 04/23/2018 12.6  12.0 - 15.0 g/dL Final  . HCT 04/12/2018 37.7  36.0 - 46.0 %  Final  . MCV 04/14/2018 86.7  80.0 - 100.0 fL Final  . MCH 04/08/2018 29.0  26.0 - 34.0 pg Final  . MCHC 03/25/2018 33.4  30.0 - 36.0 g/dL Final  . RDW 04/05/2018 14.6  11.5 - 15.5 % Final  . Platelets 04/19/2018 37* 150 - 400 K/uL Final   Comment: REPEATED TO VERIFY PLATELET COUNT CONFIRMED BY SMEAR SPECIMEN CHECKED FOR CLOTS Immature Platelet Fraction may be clinically indicated,  consider ordering this additional test [id number]   . nRBC 04/05/2018 0.0  0.0 - 0.2 % Final   Performed at [facility], [place] [address], [place], [place] [address]  . Troponin i, poc 04/03/2018 0.00  0.00 - 0.08 ng/mL Final  . Comment 3 03/27/2018          Final   Comment: Due to the release kinetics of cTnI, Brady negative result within the first hours of the onset of symptoms does not rule out myocardial infarction with certainty. If myocardial infarction is still suspected, repeat the test at appropriate intervals.   . Total Protein 04/22/2018 8.1  6.5 - 8.1 g/dL Final  . Albumin 04/11/2018 4.3  3.5 - 5.0 g/dL Final  . AST 04/18/2018 23  15 - 41 U/L Final  . ALT 04/05/2018 17  0 - 44 U/L Final  . Alkaline Phosphatase 04/09/2018 66  38 - 126 U/L Final  . Total Bilirubin 04/08/2018 4.0* 0.3 - 1.2 mg/dL Final  . Bilirubin, Direct 03/28/2018 0.4* 0.0 - 0.2 mg/dL Final  . Indirect Bilirubin 04/12/2018 3.6* 0.3 - 0.9 mg/dL Final   Performed at [facility], [place] [address], [place], [place] [address]  . Lipase 03/29/2018 52* 11 - 51 U/L Final   Performed at [facility], [place] [address], [place], [place] [address]  . Troponin I 04/04/2018 <0.03  <0.03 ng/mL Final   Performed at [facility], [place] [address] [place],  [address]  . WBC 03/25/2018 11.1* 4.0 - 10.5 K/uL Final  . RBC 04/09/2018 4.03  3.87 - 5.11 MIL/uL Final  . Hemoglobin 04/04/2018 11.8* 12.0 - 15.0 g/dL Final  . HCT 04/04/2018 35.0* 36.0 - 46.0 % Final  . MCV 04/19/2018 86.8  80.0 - 100.0 fL Final  . MCH 03/31/2018 29.3  26.0 - 34.0 pg Final  . MCHC 03/31/2018 33.7  30.0 - 36.0 g/dL Final  . RDW 04/01/2018 14.8  11.5 - 15.5 % Final  . Platelets 04/11/2018 27* 150 - 400 K/uL Corrected   Comment: REPEATED TO VERIFY SPECIMEN CHECKED FOR CLOTS Immature Platelet Fraction may be clinically indicated, consider ordering this additional test [id number] THIS CRITICAL RESULT HAS VERIFIED AND BEEN  CALLED TO S. MCCORD,RN BY TERRAN TYSOR ON 11 21 2019 AT 0042, AND HAS BEEN READ BACK. CRITICAL VALUE VERIFIED CONSISTENT WITH PREVIOUS RESULT CORRECTED ON 11/21 AT 0050: PREVIOUSLY REPORTED AS 27 REPEATED TO VERIFY SPECIMEN CHECKED FOR CLOTS Immature Platelet Fraction may be clinically indicated, consider ordering this additional test [id number] THIS CRITICAL RESULT HAS VERIFIED AND BEEN CALLED  TO S. MCCORD,RN BY TERRAN TYSOR ON 11 21 2019 AT 0042, AND HAS BEEN READ BACK. CRITICAL VALUE VERIFIED   . nRBC 04/01/2018 0.0  0.0 - 0.2 % Final  . Neutrophils Relative % 04/20/2018 79  % Final  . Neutro Abs 04/06/2018 8.7* 1.7 - 7.7 K/uL Final  . Lymphocytes Relative 04/12/2018 10  % Final  . Lymphs Abs 04/02/2018 1.1  0.7 - 4.0 K/uL Final  .  Monocytes Relative 04/16/2018 10  % Final  . Monocytes Absolute 03/31/2018 1.1* 0.1 - 1.0 K/uL Final  . Eosinophils Relative 03/31/2018 0  % Final  . Eosinophils Absolute 04/06/2018 0.0  0.0 - 0.5 K/uL Final  . Basophils Relative 04/08/2018 0  % Final  . Basophils Absolute 04/13/2018 0.1  0.0 - 0.1 K/uL Final  . Immature Granulocytes 03/25/2018 1  % Final  . Abs Immature Granulocytes 04/23/2018 0.13* 0.00 - 0.07 K/uL Final   Performed at [facility], [place] [address] [place], [place] [address]  . ABO/RH(D) 04/10/2018 B POS   Final  . Antibody Screen 04/10/2018 NEG   Final  . Sample Expiration 04/02/2018    Final                   Value:04/16/2018 Performed at [facility], [place] [address], [place], [place] [address]   . Prothrombin Time 04/12/2018 14.2  11.4 - 15.2 seconds Final  . INR 03/30/2018 1.11   Final   Performed at [facility], [place] [address], [place], [place] [address]  . Color, Urine 04/14/2018 STRAW* YELLOW Final  . APPearance 04/14/2018 CLOUDY* CLEAR Final  . Specific Gravity, Urine 04/14/2018 1.023  1.005 - 1.030 Final  . pH 04/14/2018 7.0  5.0 - 8.0 Final  . Glucose, UA 04/14/2018 50* NEGATIVE mg/dL Final  . Hgb urine  dipstick 04/14/2018 LARGE* NEGATIVE Final  . Bilirubin Urine 04/14/2018 NEGATIVE  NEGATIVE Final  . Ketones, ur 04/14/2018 NEGATIVE  NEGATIVE mg/dL Final  . Protein, ur 04/14/2018 >=300* NEGATIVE mg/dL Final  . Nitrite 04/14/2018 NEGATIVE  NEGATIVE Final  . Leukocytes, UA 04/14/2018 NEGATIVE  NEGATIVE Final  . RBC / HPF 04/14/2018 >50* 0 - 5 RBC/hpf Final  . WBC, UA 04/14/2018 21-50  0 - 5 WBC/hpf Final  . Bacteria, UA 04/14/2018 RARE* NONE SEEN Final  . Squamous Epithelial / LPF 04/14/2018 0-5  0 - 5 Final   Performed at [facility], [place] [address], [place], [place] [address]  . Prothrombin Time 03/28/2018 13.4  11.4 - 15.2 seconds Final  . INR 04/10/2018 1.03   Final  . aPTT 04/10/2018 31  24 - 36 seconds Final  . Fibrinogen 03/30/2018 497* 210 - 475 mg/dL Final  . D-Dimer, Quant 03/27/2018 2.27* 0.00 - 0.50 ug/mL-FEU Final   Comment: (NOTE) At the manufacturer cut-off of 0.50 ug/mL FEU, this assay has been documented to exclude PE with Brady sensitivity and negative predictive value of 97 to 99%.  At this time, this assay has not been approved by the FDA to exclude DVT/VTE. Results should be correlated with clinical presentation.   . Platelets 03/29/2018 27* 150 - 400 K/uL Final   Comment: Immature Platelet Fraction may be clinically indicated, consider ordering this additional test [id number] REPEATED TO VERIFY CRITICAL VALUE NOTED.  VALUE IS CONSISTENT WITH PREVIOUSLY REPORTED AND CALLED VALUE. CONSISTENT WITH PREVIOUS RESULT   . Smear Review 04/09/2018 Schistocytes present   Final   Performed at [facility], [place] [address], [place], [place] [address]  . LDH 04/14/2018 982* 98 - 192 U/L Final   Performed at [place] [address], [place], [place] [address]  . ABO/RH(D) 04/01/2018    Final                   Value:B POS Performed at [facility], [place] [address] [place], [place] [address]   .  Smear Review 04/14/2018 SMEAR STAINED AND AVAILABLE FOR  REVIEW   Final   Performed at [facility], [place] [address] [place], [place] [address]  . Unit Number 04/14/2018 [id number]   Final  . Blood Component Type 04/14/2018 PLTPHER LR1   Final  . Unit division 04/14/2018 00   Final  . Status of Unit 04/14/2018 ISSUED   Final  . Transfusion Status 04/14/2018 OK TO TRANSFUSE   Final  . ISSUE DATE / TIME 04/14/2018 [id number]   Final  . Blood Product Unit Number 04/14/2018 [id number]   Final  . PRODUCT CODE 04/14/2018 [id number]   Final  . Unit Type and Rh 04/14/2018 5100   Final  . Blood Product Expiration Date 04/14/2018 [id number]   Final  . Sodium 04/14/2018 136  135 - 145 mmol/L Final  . Potassium 04/14/2018 4.3  3.5 - 5.1 mmol/L Final  . Chloride 04/14/2018 105  98 - 111 mmol/L Final  . CO2 04/14/2018 21* 22 - 32 mmol/L Final  . Glucose, Bld 04/14/2018 121* 70 - 99 mg/dL Final  . BUN 04/14/2018 27* 8 - 23 mg/dL Final  . Creatinine, Ser 04/14/2018 1.91* 0.44 - 1.00 mg/dL Final  . Calcium 04/14/2018 9.0  8.9 - 10.3 mg/dL Final  . GFR calc non Af Amer 04/14/2018 27* >60 mL/min Final  . GFR calc Af Amer 04/14/2018 31* >60 mL/min Final   Comment: (NOTE) The eGFR has been calculated using the CKD EPI equation. This calculation has not been validated in all clinical situations. eGFR's persistently <60 mL/min signify possible Chronic Kidney Disease.   . Anion gap 04/14/2018 10  5 - 15 Final   Performed at [facility], [place] [address], [place], [place] [address]  . Plasma Exchange 04/14/2018 PENDING   Incomplete  . Plasma volume needed 04/14/2018    Final                   Value:2032 mL PER DAIN MAGODO IN HEMO Performed at [facility], [place] [address], [place], [place] [address]     (this displays the last labs from the last 3 days)  No results found for: TOTALPROTELP, ALBUMINELP, A1GS, A2GS, BETS, BETA2SER, GAMS, MSPIKE, SPEI (this displays SPEP labs)  No results found for: KPAFRELGTCHN, LAMBDASER,  KAPLAMBRATIO (kappa/lambda light chains)  No results found for: HGBA, HGBA2QUANT, HGBFQUANT, HGBSQUAN (Hemoglobinopathy evaluation)   Lab Results  Component Value Date   LDH 982 (H) 04/14/2018    No results found for: IRON, TIBC, IRONPCTSAT (Iron and TIBC)  No results found for: FERRITIN  Urinalysis    Component Value Date/Time   COLORURINE STRAW (Brady) 04/14/2018 0144   APPEARANCEUR CLOUDY (Brady) 04/14/2018 0144   LABSPEC 1.023 04/14/2018 0144   PHURINE 7.0 04/14/2018 0144   GLUCOSEU 50 (Brady) 04/14/2018 0144   HGBUR LARGE (Brady) 04/14/2018 0144   BILIRUBINUR NEGATIVE 04/14/2018 0144   KETONESUR NEGATIVE 04/14/2018 0144   PROTEINUR >=300 (Brady) 04/14/2018 0144   UROBILINOGEN 0.2 08/06/2009 1709   NITRITE NEGATIVE 04/14/2018 0144   LEUKOCYTESUR NEGATIVE 04/14/2018 0144     STUDIES: Ct Abdomen Pelvis W Contrast  Result Date: 04/14/2018 CLINICAL DATA:  Acute onset right upper quadrant pain and nausea. Elevated bilirubin. EXAM: CT ABDOMEN AND PELVIS WITH CONTRAST TECHNIQUE: Multidetector CT imaging of the abdomen and pelvis was performed using the standard protocol following bolus administration of intravenous contrast. CONTRAST:  165m OMNIPAQUE IOHEXOL 300 MG/ML  SOLN COMPARISON:  None. FINDINGS: Lower chest:  Dependent atelectasis in the lung bases. Coronary artery calcifications. Hepatobiliary: Mild diffuse fatty infiltration of the liver. No focal liver lesions. Gallbladder is distended without wall thickening, edema, or stone. Bile ducts are not dilated. Pancreas: Unremarkable. No pancreatic ductal dilatation or surrounding inflammatory changes. Spleen: Normal in size without focal abnormality. Adrenals/Urinary Tract: No adrenal gland nodules. Renal nephrograms are symmetrical. Subcentimeter cyst on the left kidney. No hydronephrosis or hydroureter. There is edema in the pararenal fat bilaterally. This may indicate pyelonephritis or inflammatory condition. Bladder wall is not thickened and  no bladder filling defects are present. Stomach/Bowel: Stomach, small bowel, and colon are not abnormally distended. No wall thickening or inflammatory changes. Appendix is normal. Vascular/Lymphatic: Aortic atherosclerosis. No enlarged abdominal or pelvic lymph nodes. Reproductive: Uterus and bilateral adnexa are unremarkable. Other: No abdominal wall hernia or abnormality. No abdominopelvic ascites. Musculoskeletal: Degenerative changes in the spine. No destructive bone lesions. IMPRESSION: 1. Nonspecific edema in the pararenal fat bilaterally. This could indicate pyelonephritis or inflammatory condition. No hydronephrosis or hydroureter. 2. Mild diffuse fatty infiltration of the liver. 3. Distended gallbladder without wall thickening or stone. Aortic Atherosclerosis (ICD10-I70.0). Electronically Signed   By: Lucienne Capers M.D.   On: 04/14/2018 01:01   Dg Chest Portable 1 View  Result Date: 04/14/2018 CLINICAL DATA:  Central line placement EXAM: PORTABLE CHEST 1 VIEW COMPARISON:  03/30/2018 FINDINGS: Right central venous catheter placed with tip over the low SVC region. No pneumothorax. Shallow inspiration with mild atelectasis in the right lung base. Heart size and pulmonary vascularity are normal for technique. No consolidation or airspace disease in the lungs. No blunting of costophrenic angles. No pneumothorax. Mediastinal contours appear intact. IMPRESSION: Right central venous catheter placed with tip over the low SVC region. No pneumothorax. Shallow inspiration with atelectasis in the right lung base. Electronically Signed   By: Lucienne Capers M.D.   On: 04/14/2018 06:36   Dg Abdomen Acute W/chest  Result Date: 04/07/2018 CLINICAL DATA:  Shortness of breath.  Dizziness. EXAM: DG ABDOMEN ACUTE W/ 1V CHEST COMPARISON:  None. FINDINGS: There is no evidence of dilated bowel loops or free intraperitoneal air. No radiopaque calculi or other significant radiographic abnormality is seen. Heart size  and mediastinal contours are within normal limits. Both lungs are clear. IMPRESSION: Negative abdominal radiographs.  No acute cardiopulmonary disease. Electronically Signed   By: Dorise Bullion III M.D   On: 03/31/2018 21:32   Ms Digital Screening Tomo Bilateral  Result Date: 04/18/2018 CLINICAL DATA:  Screening. EXAM: DIGITAL SCREENING BILATERAL MAMMOGRAM WITH TOMO AND CAD COMPARISON:  Previous exam(s). ACR Breast Density Category c: The breast tissue is heterogeneously dense, which may obscure small masses. FINDINGS: There are no findings suspicious for malignancy. Images were processed with CAD. IMPRESSION: No mammographic evidence of malignancy. Brady result letter of this screening mammogram will be mailed directly to the patient. RECOMMENDATION: Screening mammogram in one year. (Code:SM-B-01Y) BI-RADS CATEGORY  1: Negative. Electronically Signed   By: Fidela Salisbury M.D.   On: 04/08/2018 16:01    REVIEW OF BLOOD FILM: There is mild left shift in the white cell series, 2 bands.  From Brady cytopenia is confirmed, with no artifactual platelet clumps.  Red cells show crenation, and approximately 5 schistocytes per high-powered field  ASSESSMENT: 62 y.o. [place] woman admitted 04/14/2018 with evidence of pyelonephritis, found to have new severe thrombocytopenia with an elevated LDH and schistocytes on smear  (1) daily therapeutic plasma exchange-- orders written  (2) treat underlying infection  PLAN:  I met with the patient in the ED room and discussed her situation.  She may have pyelonephritis as her underlying condition, possibly with an e coli strain. In any case she has significant hematuria, Brady low platelet count, and schistocytes on blood smear review. The normal INR and APTT argue against DIC.  We discussed that the treatment of this condition involves plasma exchange-- "cleaning her blood"-- and that is why she had the line placed. She will have TPE daily until the platelet count fully  recovers and the LDH normalizes. This generally takes several days. I emphasized that this is Brady very treatable condition, and that we expect her to get well.  I will follow with you.    Rakesha has Brady good understanding of the overall plan. She agrees with it. She knows the goal of treatment in her case is cure. She will call with any problems that may develop before her next visit here.  Kim Cruel, MD   04/14/2018 7:40 AM Medical Oncology and Hematology [facility] [address] [place], [place] [address] Tel. [phone]    Fax. [phone]

## 2018-04-15 ENCOUNTER — Other Ambulatory Visit: Payer: Self-pay

## 2018-04-15 LAB — BASIC METABOLIC PANEL
Anion gap: 8 (ref 5–15)
BUN: 38 mg/dL — ABNORMAL HIGH (ref 8–23)
CALCIUM: 9.1 mg/dL (ref 8.9–10.3)
CO2: 23 mmol/L (ref 22–32)
CREATININE: 2.3 mg/dL — AB (ref 0.44–1.00)
Chloride: 107 mmol/L (ref 98–111)
GFR calc non Af Amer: 21 mL/min — ABNORMAL LOW (ref >60)
GFR, EST AFRICAN AMERICAN: 25 mL/min — AB (ref >60)
Glucose, Bld: 124 mg/dL — ABNORMAL HIGH (ref 70–99)
Potassium: 3.8 mmol/L (ref 3.5–5.1)
Sodium: 138 mmol/L (ref 135–145)

## 2018-04-15 LAB — THERAPEUTIC PLASMA EXCHANGE (BLOOD BANK)
Plasma Exchange: 2032
Plasma volume needed: 2032
UNIT DIVISION: 0
UNIT DIVISION: 0
UNIT DIVISION: 0
UNIT DIVISION: 0
UNIT DIVISION: 0
Unit division: 0
Unit division: 0
Unit division: 0

## 2018-04-15 LAB — PREPARE PLATELET PHERESIS: Unit division: 0

## 2018-04-15 LAB — TROPONIN I: Troponin I: 0.16 ng/mL (ref <0.03)

## 2018-04-15 LAB — BPAM PLATELET PHERESIS
Blood Product Expiration Date: 201911212359
ISSUE DATE / TIME: 201911210330
UNIT TYPE AND RH: 5100

## 2018-04-15 LAB — MRSA PCR SCREENING: MRSA by PCR: NEGATIVE

## 2018-04-15 LAB — HAPTOGLOBIN: Haptoglobin: 36 mg/dL (ref 34–200)

## 2018-04-15 MED ORDER — CALCIUM CARBONATE ANTACID 500 MG PO CHEW
2.0000 | CHEWABLE_TABLET | ORAL | Status: AC
  Administered 2018-04-15: 400 mg via ORAL

## 2018-04-15 MED ORDER — FAMOTIDINE 20 MG PO TABS: 20.0000 mg | ORAL_TABLET | Freq: Two times a day (BID) | ORAL | Status: DC

## 2018-04-15 MED ORDER — FAMOTIDINE 20 MG PO TABS
20.0000 mg | ORAL_TABLET | Freq: Every day | ORAL | Status: DC
  Administered 2018-04-15: 20 mg via ORAL
  Filled 2018-04-15: qty 1

## 2018-04-15 MED ORDER — SODIUM CHLORIDE 0.9 % IV SOLN
2.0000 g | Freq: Once | INTRAVENOUS | Status: AC
  Administered 2018-04-15: 2 g via INTRAVENOUS
  Filled 2018-04-15 (×2): qty 20

## 2018-04-15 MED ORDER — POLYETHYLENE GLYCOL 3350 17 G PO PACK
17.0000 g | PACK | Freq: Every day | ORAL | Status: DC | PRN
  Administered 2018-04-15: 17 g via ORAL
  Filled 2018-04-15: qty 1

## 2018-04-15 MED ORDER — CALCIUM CARBONATE ANTACID 500 MG PO CHEW
CHEWABLE_TABLET | ORAL | Status: AC
  Administered 2018-04-15: 400 mg via ORAL
  Filled 2018-04-15: qty 1

## 2018-04-15 MED ORDER — DIPHENHYDRAMINE HCL 25 MG PO CAPS
ORAL_CAPSULE | ORAL | Status: AC
  Filled 2018-04-15: qty 1

## 2018-04-15 MED ORDER — BISMUTH SUBSALICYLATE 262 MG/15ML PO SUSP
30.0000 mL | Freq: Three times a day (TID) | ORAL | Status: DC | PRN
  Administered 2018-04-15 – 2018-04-16 (×3): 30 mL via ORAL
  Filled 2018-04-15 (×2): qty 236

## 2018-04-15 MED ORDER — ACD FORMULA A 0.73-2.45-2.2 GM/100ML VI SOLN
Status: AC
  Filled 2018-04-15: qty 500

## 2018-04-15 MED ORDER — ACD FORMULA A 0.73-2.45-2.2 GM/100ML VI SOLN
500.0000 mL | Status: DC
  Filled 2018-04-15: qty 500

## 2018-04-15 MED ORDER — ACETAMINOPHEN 325 MG PO TABS
650.0000 mg | ORAL_TABLET | ORAL | Status: DC | PRN
  Administered 2018-04-15 – 2018-04-16 (×2): 650 mg via ORAL
  Filled 2018-04-15 (×3): qty 2

## 2018-04-15 MED ORDER — DIPHENHYDRAMINE HCL 25 MG PO CAPS
25.0000 mg | ORAL_CAPSULE | Freq: Four times a day (QID) | ORAL | Status: DC | PRN
  Administered 2018-04-15: 25 mg via ORAL

## 2018-04-15 MED ORDER — ANTICOAGULANT SODIUM CITRATE 4% (200MG/5ML) IV SOLN
5.0000 mL | Freq: Once | Status: AC
  Administered 2018-04-15: 5 mL
  Filled 2018-04-15: qty 5

## 2018-04-15 NOTE — Progress Notes (Signed)
[facility]                                                                              Patient Demographics  Kim Brady, is a 62 y.o. female, DOB - May 09, 1956, ZOX:[id number]  Admit date - 04/12/2018   Admitting Physician [name], MD  Outpatient Primary MD for the patient is [name], [name], PA-C  Outpatient specialists:   LOS - 1  days   Medical records reviewed and are as summarized below:    Chief Complaint  Patient presents with  . Chest Pain       Brief summary   Patient is a 62 year old female with CKD stage II, hypertension, CAD presented with nausea, vomiting, upper abdominal pain with flank pain.  Patient presented to the ED, where she was found to have gross hematuria.  Hemoglobin was 12.6, platelets 37,000, recent CBC on 2/22 had shown platelet count of 341K. CT abdomen showed bilateral perirenal fat edema suggestive of pyelonephritis.  No hydronephrosis.  Due to concern for acute TTP and pyelonephritis, patient was admitted for further work-up.   Assessment & Plan    Principal Problem: Acute TTP (thrombotic thrombocytopenic purpura) (HCC) -Unclear etiology, possibly due to UTI and pyelonephritis.  New severe thrombocytopenia with schistocytes, LDH.  INR 1.0, normal APTT. -Dr. Darnelle CatalanMagrinat following, [facility], patient underwent plasma exchanged #1 on 11/21 -Follow CBC, renal panel daily -Platelets down to 7K today, plasmapheresis #2 today, per hematology transfuse if patient has any bleeding  Active Problems: Acute pyelonephritis with UTI -Urine culture shows >100,000 colonies of E. coli, continue IV Rocephin, follow sensitivities.      Essential hypertension, benign -BP currently stable, continue beta-blocker   Intractable nausea and vomiting -Likely due to #1, patient feels a lot better today, advance diet to solids per request  Constipation Placed on bowel regimen   Code Status: Full CODE STATUS DVT Prophylaxis:  SCDs Family Communication: Discussed in detail with the patient, all imaging results, lab results explained to the patient.  Discussed with nephew yesterday   Disposition Plan: High risk of deterioration with acute TTP and polynephritis, will remain in stepdown  Time Spent in minutes   25 minutes  Procedures:  HD catheter insertion on 11/21  Consultants:   Heme oncology CCM  Antimicrobials:   IV Rocephin 11/20>>   Medications  Scheduled Meds: . atorvastatin  40 mg Oral Daily  . calcium carbonate      . calcium carbonate  2 tablet Oral Q3H  . famotidine  20 mg Oral Daily  . heparin  2,400 Units Intravenous Once  . metoprolol tartrate  25 mg Oral BID  . sodium chloride flush  3 mL Intravenous Q12H   Continuous Infusions: . sodium chloride    . sodium chloride 100 mL/hr at 04/15/18 1000  . anticoagulant sodium citrate    . calcium gluconate IVPB    . cefTRIAXone (ROCEPHIN)  IV Stopped (04/15/18 0523)  . citrate dextrose    . citrate dextrose     PRN Meds:.sodium chloride, acetaminophen, bismuth subsalicylate, diphenhydrAMINE, morphine injection, ondansetron **OR** ondansetron (ZOFRAN) IV, polyethylene glycol, sodium chloride flush  Antibiotics   Anti-infectives (From admission, onward)   Start     Dose/Rate Route Frequency Ordered Stop   04/15/18 0500  cefTRIAXone (ROCEPHIN) 1 g in sodium chloride 0.9 % 100 mL IVPB     1 g 200 mL/hr over 30 Minutes Intravenous Every 24 hours 04/14/18 1009     04/14/18 0530  cefTRIAXone (ROCEPHIN) 2 g in sodium chloride 0.9 % 100 mL IVPB     2 g 200 mL/hr over 30 Minutes Intravenous  Once 04/14/18 0520 04/14/18 1478        Subjective:   Kim Brady was seen and examined today.  Overall feeling better, no nausea and vomiting today, no fevers.  Constipation +.  Patient denies any dizziness, chest pain, shortness of breath.   Objective:   Vitals:   04/15/18 0900 04/15/18 1000 04/15/18 1011 04/15/18 1147  BP:  136/83  136/83   Pulse: 72 84 86   Resp: 16 18    Temp:    98.6 F (37 C)  TempSrc:    Oral  SpO2: 96% 92%    Weight:      Height:        Intake/Output Summary (Last 24 hours) at 04/15/2018 1303 Last data filed at 04/15/2018 1147 Gross per 24 hour  Intake 1786 ml  Output 1600 ml  Net 186 ml     Wt Readings from Last 3 Encounters:  04/15/18 70.1 kg  04/12/18 69.4 kg  01/07/18 68.8 kg    Physical Exam  General: Alert and oriented x 3, NAD  Eyes: PERRLA, EOMI, Anicteric Sclera,  HEENT:   Cardiovascular: S1 S2 auscultated, Regular rate and rhythm. No pedal edema b/l  Respiratory: Clear to auscultation bilaterally, no wheezing, rales or rhonchi  Gastrointestinal: Soft, nontender, nondistended, + bowel sounds  Ext: no pedal edema bilaterally  Neuro: no new deficits  Musculoskeletal: No digital cyanosis, clubbing  Skin: No rashes  Psych: Normal affect and demeanor, alert and oriented x3       Data Reviewed:  I have personally reviewed following labs and imaging studies  Micro Results Recent Results (from the past 240 hour(s))  Urine culture     Status: Abnormal (Preliminary result)   Collection Time: 04/14/18  1:44 AM  Result Value Ref Range Status   Specimen Description URINE, CLEAN CATCH  Final   Special Requests   Final    NONE Performed at [facility], [address], [place], [place] [address]    Culture >=100,000 COLONIES/mL ESCHERICHIA COLI (A)  Final   Report Status PENDING  Incomplete  MRSA PCR Screening     Status: None   Collection Time: 04/14/18  9:54 PM  Result Value Ref Range Status   MRSA by PCR NEGATIVE NEGATIVE Final    Comment:        The GeneXpert MRSA Assay (FDA approved for NASAL specimens only), is one component of a comprehensive MRSA colonization surveillance program. It is not intended to diagnose MRSA infection nor to guide or monitor treatment for MRSA infections. Performed at [facility], [address], [place], [place] [address]     Radiology Reports Ct Abdomen Pelvis W Contrast  Result Date: 04/14/2018 CLINICAL DATA:  Acute onset right upper quadrant pain and nausea. Elevated bilirubin. EXAM: CT ABDOMEN AND PELVIS WITH CONTRAST TECHNIQUE: Multidetector CT imaging of the abdomen and pelvis was performed using the standard protocol following bolus administration of intravenous contrast. CONTRAST:  OMNIPAQUE IOHEXOL 300 MG/ML  SOLN COMPARISON:  None. FINDINGS: Lower chest: Dependent atelectasis in the lung bases. Coronary artery calcifications. Hepatobiliary: Mild diffuse fatty infiltration of the liver. No focal liver lesions. Gallbladder is distended without wall thickening, edema, or stone. Bile ducts are not dilated. Pancreas: Unremarkable. No pancreatic ductal dilatation or surrounding inflammatory changes. Spleen: Normal in size without focal abnormality. Adrenals/Urinary Tract: No adrenal gland nodules. Renal nephrograms are symmetrical. Subcentimeter cyst on the left kidney. No hydronephrosis or hydroureter. There is edema in the pararenal fat bilaterally. This may indicate pyelonephritis or inflammatory condition. Bladder wall is not thickened and no bladder filling defects are present. Stomach/Bowel: Stomach, small bowel, and colon are not abnormally distended. No wall thickening or inflammatory changes. Appendix is normal. Vascular/Lymphatic: Aortic atherosclerosis. No enlarged abdominal or pelvic lymph nodes. Reproductive: Uterus and bilateral adnexa are unremarkable. Other: No abdominal wall hernia or abnormality. No abdominopelvic ascites. Musculoskeletal: Degenerative changes in the spine. No destructive bone lesions. IMPRESSION: 1. Nonspecific edema in the pararenal fat bilaterally. This could indicate pyelonephritis or inflammatory condition. No hydronephrosis or hydroureter. 2. Mild diffuse fatty infiltration of the liver. 3. Distended gallbladder without wall thickening or stone.  Aortic Atherosclerosis (ICD10-I70.0). Electronically Signed   By: Burman Nieves M.D.   On: 04/14/2018 01:01   Dg Chest Portable 1 View  Result Date: 04/14/2018 CLINICAL DATA:  Central line placement EXAM: PORTABLE CHEST 1 VIEW COMPARISON:  03/28/2018 FINDINGS: Right central venous catheter placed with tip over the low SVC region. No pneumothorax. Shallow inspiration with mild atelectasis in the right lung base. Heart size and pulmonary vascularity are normal for technique. No consolidation or airspace disease in the lungs. No blunting of costophrenic angles. No pneumothorax. Mediastinal contours appear intact. IMPRESSION: Right central venous catheter placed with tip over the low SVC region. No pneumothorax. Shallow inspiration with atelectasis in the right lung base. Electronically Signed   By: Burman Nieves M.D.   On: 04/14/2018 06:36   Dg Abdomen Acute W/chest  Result Date: 04/01/2018 CLINICAL DATA:  Shortness of breath.  Dizziness. EXAM: DG ABDOMEN ACUTE W/ 1V CHEST COMPARISON:  None. FINDINGS: There is no evidence of dilated bowel loops or free intraperitoneal air. No radiopaque calculi or other significant radiographic abnormality is seen. Heart size and mediastinal contours are within normal limits. Both lungs are clear. IMPRESSION: Negative abdominal radiographs.  No acute cardiopulmonary disease. Electronically Signed   By: Gerome Sam III M.D   On: 04/18/2018 21:32   Ms Digital Screening Tomo Bilateral  Result Date: 03/26/2018 CLINICAL DATA:  Screening. EXAM: DIGITAL SCREENING BILATERAL MAMMOGRAM WITH TOMO AND CAD COMPARISON:  Previous exam(s). ACR Breast Density Category c: The breast tissue is heterogeneously dense, which may obscure small masses. FINDINGS: There are no findings suspicious for malignancy. Images were processed with CAD. IMPRESSION: No mammographic evidence of malignancy. A result letter of this screening mammogram will be mailed directly to the patient.  RECOMMENDATION: Screening mammogram in one year. (Code:[id number]-[id number]) BI-RADS CATEGORY  1: Negative. Electronically Signed   By: Ted Mcalpine M.D.   On: 04/23/2018 16:01    Lab Data:  CBC: Recent Labs  Lab 04/06/2018 2014 04/12/2018 2357 04/14/18 0636 04/15/18 0315  WBC 6.6 11.1* 13.0* 8.2  NEUTROABS  --  8.7*  --  5.8  HGB 12.6 11.8* 10.9* 9.4*  HCT 37.7 35.0* 31.9* 28.9*  MCV 86.7 86.8 86.4 85.8  PLT 37* 27*  27* 55* 7*   Basic Metabolic Panel: Recent Labs  Lab 03/26/2018 2014 04/14/18 0636 04/15/18 0315  NA 137 136 138  K 3.1* 4.3 3.8  CL 107 105 107  CO2 19* 21* 23  GLUCOSE 110* 121* 124*  BUN 13 27* 38*  CREATININE 0.93 1.91* 2.30*  CALCIUM 9.6 9.0 9.1   GFR: Estimated Creatinine Clearance: 21.6 mL/min (A) (by C-G formula based on SCr of 2.3 mg/dL (H)). Liver Function Tests: Recent Labs  Lab 07-May-2018 2014  AST 23  ALT 17  ALKPHOS 66  BILITOT 4.0*  PROT 8.1  ALBUMIN 4.3   Recent Labs  Lab 05-07-18 2014  LIPASE 52*   No results for input(s): AMMONIA in the last 168 hours. Coagulation Profile: Recent Labs  Lab May 07, 2018 2357  INR 1.03  1.11   Cardiac Enzymes: Recent Labs  Lab 05-07-18 2357  TROPONINI <0.03   BNP (last 3 results) No results for input(s): PROBNP in the last 8760 hours. HbA1C: No results for input(s): HGBA1C in the last 72 hours. CBG: Recent Labs  Lab 04/14/18 0750  GLUCAP 119*   Lipid Profile: No results for input(s): CHOL, HDL, LDLCALC, TRIG, CHOLHDL, LDLDIRECT in the last 72 hours. Thyroid Function Tests: No results for input(s): TSH, T4TOTAL, FREET4, T3FREE, THYROIDAB in the last 72 hours. Anemia Panel: No results for input(s): VITAMINB12, FOLATE, FERRITIN, TIBC, IRON, RETICCTPCT in the last 72 hours. Urine analysis:    Component Value Date/Time   COLORURINE STRAW (A) 04/14/2018 0144   APPEARANCEUR CLOUDY (A) 04/14/2018 0144   LABSPEC 1.023 04/14/2018 0144   PHURINE 7.0 04/14/2018 0144   GLUCOSEU 50 (A)  04/14/2018 0144   HGBUR LARGE (A) 04/14/2018 0144   BILIRUBINUR NEGATIVE 04/14/2018 0144   KETONESUR NEGATIVE 04/14/2018 0144   PROTEINUR >=300 (A) 04/14/2018 0144   UROBILINOGEN 0.2 08/06/2009 1709   NITRITE NEGATIVE 04/14/2018 0144   LEUKOCYTESUR NEGATIVE 04/14/2018 0144     Onalee Steinbach M.D. [facility] 04/15/2018, 1:03 PM  Pager: [phone] Between 7am to 7pm - call Pager - [phone]  After 7pm go to [email or web address] - password [email or web address]  Call night coverage person covering after 7pm

## 2018-04-15 NOTE — Progress Notes (Signed)
CRITICAL VALUE ALERT  Critical Value:  Troponin 0.16  Date & Time Notified:  04/15/2018 11:07  Provider Notified: [facility]-on call, Bodenheimer, NP  Orders Received/Actions taken: CP now resolved. Continue to trend troponins Q6 for 2 more occurrences. Caswell Corwinana C Lucilia Yanni, RN 04/15/18 11:16 PM

## 2018-04-15 NOTE — Progress Notes (Signed)
Pt called said at first she had Chest pain but cannot assign a number because she says it doesn't hurt just burns like when she is at home - takes Zantac & Pepto bismol at home - pt did eat 95 % of her breakfast also worried maybe the orange juice made it burn. Pt related to me she has constipation at home and has not had a BM since 11/18. Pt says she just lets it happen & does not take anything for the constipation. Talked with pt about eating fresh fruits, veggies, plenty of water and possibly more fiber - Dr Isidoro Donningai notified of all above per text page

## 2018-04-15 NOTE — Progress Notes (Signed)
CRITICAL VALUE ALERT  Critical Value:  Platelets 7  Date & Time Notified:  04/15/2018 04:16AM  Provider Notified: Pola CornELINK  Orders Received/Actions taken: No active signs of bleeding. Pt supposed to be receiving TPE later today. Will continue to monitor closely. Caswell Corwinana C Jordie Skalsky, RN 04/15/18 4:17 AM

## 2018-04-15 NOTE — Progress Notes (Signed)
eLink Physician-Brief Progress Note Patient Name: Kim Brady DOB: [birth year] MRN: [id number]   Date of Service  04/15/2018  HPI/Events of Note  Pt admitted with UTI and TTP s/p plasmapheresis. Platelet count is now 7  eICU Interventions  Discuss with Hematology regarding platelet transfusion while awaitig next PLAX. Per Dr. Vanna ScotlandShadad,hold platelet transfusion unless there is evidence of bleeding. No evidence of overt hemorrhage.        Kim Brady 04/15/2018, 4:55 AM

## 2018-04-15 NOTE — Progress Notes (Signed)
Pt complaining of chest pain 10/10 [facility]. EKG obtained and placed in chart. 2mg  morphine given. CP resolved with morphine and rest. Orders received to trend troponins. Will continue to monitor closely. Caswell Corwinana C Jovanni Rash, RN 04/15/18 9:15 PM

## 2018-04-16 ENCOUNTER — Inpatient Hospital Stay (HOSPITAL_COMMUNITY): Payer: Medicaid Other

## 2018-04-16 DIAGNOSIS — R319 Hematuria, unspecified: Secondary | ICD-10-CM

## 2018-04-16 DIAGNOSIS — K760 Fatty (change of) liver, not elsewhere classified: Secondary | ICD-10-CM

## 2018-04-16 DIAGNOSIS — M549 Dorsalgia, unspecified: Secondary | ICD-10-CM

## 2018-04-16 DIAGNOSIS — R1013 Epigastric pain: Secondary | ICD-10-CM

## 2018-04-16 DIAGNOSIS — R609 Edema, unspecified: Secondary | ICD-10-CM

## 2018-04-16 DIAGNOSIS — I700 Atherosclerosis of aorta: Secondary | ICD-10-CM

## 2018-04-16 DIAGNOSIS — R079 Chest pain, unspecified: Secondary | ICD-10-CM

## 2018-04-16 DIAGNOSIS — D696 Thrombocytopenia, unspecified: Secondary | ICD-10-CM

## 2018-04-16 LAB — BASIC METABOLIC PANEL
ANION GAP: 8 (ref 5–15)
BUN: 28 mg/dL — AB (ref 8–23)
CO2: 25 mmol/L (ref 22–32)
Calcium: 8.8 mg/dL — ABNORMAL LOW (ref 8.9–10.3)
Chloride: 106 mmol/L (ref 98–111)
Creatinine, Ser: 1.61 mg/dL — ABNORMAL HIGH (ref 0.44–1.00)
GFR calc Af Amer: 38 mL/min — ABNORMAL LOW (ref >60)
GFR, EST NON AFRICAN AMERICAN: 33 mL/min — AB (ref >60)
GLUCOSE: 118 mg/dL — AB (ref 70–99)
POTASSIUM: 3.3 mmol/L — AB (ref 3.5–5.1)
Sodium: 139 mmol/L (ref 135–145)

## 2018-04-16 LAB — CBC WITH DIFFERENTIAL/PLATELET
ABS IMMATURE GRANULOCYTES: 0.06 10*3/uL (ref 0.00–0.07)
Abs Immature Granulocytes: 0.07 10*3/uL (ref 0.00–0.07)
Abs Immature Granulocytes: 0.32 10*3/uL — ABNORMAL HIGH (ref 0.00–0.07)
BASOS ABS: 0 10*3/uL (ref 0.0–0.1)
BASOS PCT: 0 %
Basophils Absolute: 0 10*3/uL (ref 0.0–0.1)
Basophils Absolute: 0 10*3/uL (ref 0.0–0.1)
Basophils Relative: 0 %
Basophils Relative: 0 %
EOS ABS: 0 10*3/uL (ref 0.0–0.5)
EOS ABS: 0 10*3/uL (ref 0.0–0.5)
EOS PCT: 0 %
EOS PCT: 0 %
Eosinophils Absolute: 0 10*3/uL (ref 0.0–0.5)
Eosinophils Relative: 0 %
HCT: 27.4 % — ABNORMAL LOW (ref 36.0–46.0)
HEMATOCRIT: 28.9 % — AB (ref 36.0–46.0)
HEMATOCRIT: 20.8 % — AB (ref 36.0–46.0)
Hemoglobin: 9.4 g/dL — ABNORMAL LOW (ref 12.0–15.0)
Hemoglobin: 6.8 g/dL — CL (ref 12.0–15.0)
Hemoglobin: 9.4 g/dL — ABNORMAL LOW (ref 12.0–15.0)
IMMATURE GRANULOCYTES: 1 %
Immature Granulocytes: 1 %
Immature Granulocytes: 3 %
LYMPHS ABS: 1.1 10*3/uL (ref 0.7–4.0)
LYMPHS ABS: 2.2 10*3/uL (ref 0.7–4.0)
LYMPHS PCT: 14 %
Lymphocytes Relative: 29 %
Lymphocytes Relative: 11 %
Lymphs Abs: 1.3 10*3/uL (ref 0.7–4.0)
MCH: 27.9 pg (ref 26.0–34.0)
MCH: 28.2 pg (ref 26.0–34.0)
MCH: 29 pg (ref 26.0–34.0)
MCHC: 32.5 g/dL (ref 30.0–36.0)
MCHC: 32.7 g/dL (ref 30.0–36.0)
MCHC: 34.3 g/dL (ref 30.0–36.0)
MCV: 85.8 fL (ref 80.0–100.0)
MCV: 86.3 fL (ref 80.0–100.0)
MCV: 84.6 fL (ref 80.0–100.0)
MONO ABS: 1.2 10*3/uL — AB (ref 0.1–1.0)
MONO ABS: 1.3 10*3/uL — AB (ref 0.1–1.0)
MONOS PCT: 15 %
MONOS PCT: 17 %
Monocytes Absolute: 2.1 10*3/uL — ABNORMAL HIGH (ref 0.1–1.0)
Monocytes Relative: 17 %
NEUTROS ABS: 4 10*3/uL (ref 1.7–7.7)
NEUTROS PCT: 53 %
NRBC: 0.2 % (ref 0.0–0.2)
NRBC: 0.4 % — AB (ref 0.0–0.2)
Neutro Abs: 5.8 10*3/uL (ref 1.7–7.7)
Neutro Abs: 8.6 10*3/uL — ABNORMAL HIGH (ref 1.7–7.7)
Neutrophils Relative %: 70 %
Neutrophils Relative %: 69 %
Platelets: 7 10*3/uL — CL (ref 150–400)
Platelets: 6 10*3/uL — CL (ref 150–400)
Platelets: <5 10*3/uL — CL (ref 150–400)
RBC: 3.37 MIL/uL — ABNORMAL LOW (ref 3.87–5.11)
RBC: 2.41 MIL/uL — ABNORMAL LOW (ref 3.87–5.11)
RBC: 3.24 MIL/uL — AB (ref 3.87–5.11)
RDW: 16.5 % — AB (ref 11.5–15.5)
RDW: 15.9 % — ABNORMAL HIGH (ref 11.5–15.5)
RDW: 15.9 % — AB (ref 11.5–15.5)
WBC: 8.2 10*3/uL (ref 4.0–10.5)
WBC: 7.7 10*3/uL (ref 4.0–10.5)
WBC: 12.4 10*3/uL — AB (ref 4.0–10.5)
nRBC: 0.3 % — ABNORMAL HIGH (ref 0.0–0.2)

## 2018-04-16 LAB — THERAPEUTIC PLASMA EXCHANGE (BLOOD BANK)
PLASMA VOLUME NEEDED: 2032
UNIT DIVISION: 0
UNIT DIVISION: 0
UNIT DIVISION: 0
Unit division: 0
Unit division: 0
Unit division: 0
Unit division: 0

## 2018-04-16 LAB — URINE CULTURE

## 2018-04-16 LAB — AMYLASE: Amylase: 225 U/L — ABNORMAL HIGH (ref 28–100)

## 2018-04-16 LAB — POCT I-STAT, CHEM 8
BUN: 33 mg/dL — ABNORMAL HIGH (ref 8–23)
CALCIUM ION: 1.29 mmol/L (ref 1.15–1.40)
CHLORIDE: 107 mmol/L (ref 98–111)
Creatinine, Ser: 1.3 mg/dL — ABNORMAL HIGH (ref 0.44–1.00)
Glucose, Bld: 109 mg/dL — ABNORMAL HIGH (ref 70–99)
HEMATOCRIT: 28 % — AB (ref 36.0–46.0)
HEMOGLOBIN: 9.5 g/dL — AB (ref 12.0–15.0)
Potassium: 4.1 mmol/L (ref 3.5–5.1)
Sodium: 143 mmol/L (ref 135–145)
TCO2: 26 mmol/L (ref 22–32)

## 2018-04-16 LAB — LIPASE, BLOOD: LIPASE: 607 U/L — AB (ref 11–51)

## 2018-04-16 LAB — TROPONIN I
TROPONIN I: 0.12 ng/mL — AB (ref <0.03)
Troponin I: 0.13 ng/mL (ref <0.03)

## 2018-04-16 LAB — PREPARE RBC (CROSSMATCH)

## 2018-04-16 MED ORDER — SUCRALFATE 1 GM/10ML PO SUSP
1.0000 g | Freq: Three times a day (TID) | ORAL | Status: DC | PRN
  Administered 2018-04-16: 1 g via ORAL
  Filled 2018-04-16: qty 10

## 2018-04-16 MED ORDER — HYDROMORPHONE HCL 1 MG/ML IJ SOLN
2.0000 mg | INTRAMUSCULAR | Status: DC | PRN
  Administered 2018-04-16 (×2): 2 mg via INTRAVENOUS
  Filled 2018-04-16 (×2): qty 2

## 2018-04-16 MED ORDER — SENNOSIDES-DOCUSATE SODIUM 8.6-50 MG PO TABS
1.0000 | ORAL_TABLET | Freq: Two times a day (BID) | ORAL | Status: DC
  Administered 2018-04-16: 1 via ORAL
  Filled 2018-04-16: qty 1

## 2018-04-16 MED ORDER — HYDROMORPHONE HCL 2 MG/ML IJ SOLN
2.0000 mg | INTRAMUSCULAR | Status: DC | PRN
  Administered 2018-04-16: 2 mg via INTRAVENOUS
  Filled 2018-04-16: qty 1

## 2018-04-16 MED ORDER — CALCIUM CARBONATE ANTACID 500 MG PO CHEW
2.0000 | CHEWABLE_TABLET | ORAL | Status: AC
  Administered 2018-04-16: 400 mg via ORAL

## 2018-04-16 MED ORDER — SODIUM CHLORIDE 0.9 % IV SOLN
4.0000 g | Freq: Once | INTRAVENOUS | Status: AC
  Administered 2018-04-16: 4 g via INTRAVENOUS
  Filled 2018-04-16 (×2): qty 40

## 2018-04-16 MED ORDER — ACETAMINOPHEN 325 MG PO TABS: 650.0000 mg | ORAL_TABLET | ORAL | Status: DC | PRN

## 2018-04-16 MED ORDER — POLYETHYLENE GLYCOL 3350 17 G PO PACK: 17.0000 g | PACK | Freq: Every day | ORAL | Status: DC

## 2018-04-16 MED ORDER — ACD FORMULA A 0.73-2.45-2.2 GM/100ML VI SOLN
500.0000 mL | Status: DC
  Administered 2018-04-16: 500 mL via INTRAVENOUS
  Filled 2018-04-16 (×4): qty 500

## 2018-04-16 MED ORDER — PANTOPRAZOLE SODIUM 40 MG IV SOLR
40.0000 mg | Freq: Two times a day (BID) | INTRAVENOUS | Status: DC
  Administered 2018-04-16 (×2): 40 mg via INTRAVENOUS
  Filled 2018-04-16 (×2): qty 40

## 2018-04-16 MED ORDER — DIPHENHYDRAMINE HCL 25 MG PO CAPS: 25.0000 mg | ORAL_CAPSULE | Freq: Four times a day (QID) | ORAL | Status: DC | PRN

## 2018-04-16 MED ORDER — ANTICOAGULANT SODIUM CITRATE 4% (200MG/5ML) IV SOLN
5.0000 mL | Freq: Once | Status: AC
  Administered 2018-04-16: 5 mL
  Filled 2018-04-16 (×2): qty 5

## 2018-04-16 MED ORDER — SODIUM CHLORIDE 0.9% IV SOLUTION: Freq: Once | INTRAVENOUS | Status: DC

## 2018-04-16 MED ORDER — PANTOPRAZOLE SODIUM 40 MG PO TBEC: 40.0000 mg | DELAYED_RELEASE_TABLET | Freq: Every day | ORAL | Status: DC

## 2018-04-16 MED ORDER — POTASSIUM CHLORIDE 10 MEQ/100ML IV SOLN
10.0000 meq | INTRAVENOUS | Status: AC
  Administered 2018-04-16 (×3): 10 meq via INTRAVENOUS
  Filled 2018-04-16 (×3): qty 100

## 2018-04-16 MED ORDER — SODIUM CHLORIDE 0.9% IV SOLUTION
Freq: Once | INTRAVENOUS | Status: AC
  Administered 2018-04-16: 23:00:00 via INTRAVENOUS

## 2018-04-16 MED ORDER — ACD FORMULA A 0.73-2.45-2.2 GM/100ML VI SOLN
Status: AC
  Filled 2018-04-16: qty 500

## 2018-04-16 NOTE — Progress Notes (Signed)
Kim Brady   DOB:[birth year]   NW#:[id number]   MVH#:[id number]  Subjective:  Complains of "chest pain" but points to epigastrium. Also has back pain. Tells me urine is "a little pink," not bloody. Denies overt bleeding or bruising. Tolarating pheresis w/o event. No family in room   Objective: middle aged African American woman examined on recliner Vitals:   04/16/18 0645 04/16/18 0705  BP: (!) 150/90 (!) 161/84  Pulse: 75 72  Resp: (!) 21 (!) 23  Temp: 98.4 F (36.9 C) 98.3 F (36.8 C)  SpO2: 100% 97%    Body mass index is 30.18 kg/m.  Intake/Output Summary (Last 24 hours) at 04/16/2018 0748 Last data filed at 04/16/2018 0700 Gross per 24 hour  Intake 2723 ml  Output 2325 ml  Net 398 ml     .  Oropharynx shows no thrush or other lesions  No cervical or supraclavicular adenopathy  Lungs no rales or wheezes  Heart regular rate and rhythm  Abdomen soft, +BS  Neuro nonfocal  Breast exam: deferred  CBG (last 3)  Recent Labs    04/14/18 0750  GLUCAP 119*     Labs:  Lab Results  Component Value Date   WBC 7.7 04/16/2018   HGB 6.8 (LL) 04/16/2018   HCT 20.8 (L) 04/16/2018   MCV 86.3 04/16/2018   PLT 6 (LL) 04/16/2018   NEUTROABS 4.0 04/16/2018    @LASTCHEMISTRY @  Urine Studies No results for input(s): UHGB, CRYS in the last 72 hours.  Invalid input(s): UACOL, UAPR, USPG, UPH, UTP, UGL, UKET, UBIL, UNIT, UROB, [place], UEPI, UWBC, Donnella Bi [place], [place]  Basic Metabolic Panel: Recent Labs  Lab May 02, 2018 2014 04/14/18 0636 04/15/18 0315 04/16/18 0335  NA 137 136 138 139  K 3.1* 4.3 3.8 3.3*  CL 107 105 107 106  CO2 19* 21* 23 25  GLUCOSE 110* 121* 124* 118*  BUN 13 27* 38* 28*  CREATININE 0.93 1.91* 2.30* 1.61*  CALCIUM 9.6 9.0 9.1 8.8*   GFR Estimated Creatinine Clearance: 30.8 mL/min (A) (by C-G formula based on SCr of 1.61 mg/dL (H)). Liver Function Tests: Recent Labs  Lab 05/02/18 2014  AST 23  ALT 17  ALKPHOS 66  BILITOT 4.0*   PROT 8.1  ALBUMIN 4.3   Recent Labs  Lab 05/02/18 2014  LIPASE 52*   No results for input(s): AMMONIA in the last 168 hours. Coagulation profile Recent Labs  Lab May 02, 2018 2357  INR 1.03  1.11    CBC: Recent Labs  Lab May 02, 2018 2014 2018-05-02 2357 04/14/18 0636 04/15/18 0315 04/16/18 0335  WBC 6.6 11.1* 13.0* 8.2 7.7  NEUTROABS  --  8.7*  --  5.8 4.0  HGB 12.6 11.8* 10.9* 9.4* 6.8*  HCT 37.7 35.0* 31.9* 28.9* 20.8*  MCV 86.7 86.8 86.4 85.8 86.3  PLT 37* 27*  27* 55* 7* 6*   Cardiac Enzymes: Recent Labs  Lab 05/02/2018 2357 04/15/18 2157 04/16/18 0335  TROPONINI <0.03 0.16* 0.13*   BNP: Invalid input(s): POCBNP CBG: Recent Labs  Lab 04/14/18 0750  GLUCAP 119*   D-Dimer Recent Labs    2018-05-02 2357  DDIMER 2.27*   Hgb A1c No results for input(s): HGBA1C in the last 72 hours. Lipid Profile No results for input(s): CHOL, HDL, LDLCALC, TRIG, CHOLHDL, LDLDIRECT in the last 72 hours. Thyroid function studies No results for input(s): TSH, T4TOTAL, T3FREE, THYROIDAB in the last 72 hours.  Invalid input(s): FREET3 Anemia work up No results for input(s): VITAMINB12, FOLATE, FERRITIN,  TIBC, IRON, RETICCTPCT in the last 72 hours. Microbiology Recent Results (from the past 240 hour(s))  Urine culture     Status: Abnormal (Preliminary result)   Collection Time: 04/14/18  1:44 AM  Result Value Ref Range Status   Specimen Description URINE, CLEAN CATCH  Final   Special Requests   Final    NONE Performed at [facility], [address] [place], [place] [address]    Culture >=100,000 COLONIES/mL ESCHERICHIA COLI (A)  Final   Report Status PENDING  Incomplete  Blood culture (routine x 2)     Status: None (Preliminary result)   Collection Time: 04/14/18  1:55 AM  Result Value Ref Range Status   Specimen Description BLOOD RIGHT ANTECUBITAL  Final   Special Requests   Final    BOTTLES DRAWN AEROBIC AND ANAEROBIC Blood Culture adequate volume   Culture    Final    NO GROWTH 1 DAY Performed at [facility], [address] [place], [place] [address]    Report Status PENDING  Incomplete  Blood culture (routine x 2)     Status: None (Preliminary result)   Collection Time: 04/14/18  2:08 AM  Result Value Ref Range Status   Specimen Description BLOOD LEFT HAND  Final   Special Requests   Final    BOTTLES DRAWN AEROBIC AND ANAEROBIC Blood Culture results may not be optimal due to an inadequate volume of blood received in culture bottles   Culture   Final    NO GROWTH 1 DAY Performed at [facility], [address] [place], [place] [address]    Report Status PENDING  Incomplete  MRSA PCR Screening     Status: None   Collection Time: 04/14/18  9:54 PM  Result Value Ref Range Status   MRSA by PCR NEGATIVE NEGATIVE Final    Comment:        The GeneXpert MRSA Assay (FDA approved for NASAL specimens only), is one component of a comprehensive MRSA colonization surveillance program. It is not intended to diagnose MRSA infection nor to guide or monitor treatment for MRSA infections. Performed at [facility], [address] [place], [place] [address]       Studies:  Ct Abdomen Pelvis W Contrast  Result Date: 04/14/2018 CLINICAL DATA:  Acute onset right upper quadrant pain and nausea. Elevated bilirubin. EXAM: CT ABDOMEN AND PELVIS WITH CONTRAST TECHNIQUE: Multidetector CT imaging of the abdomen and pelvis was performed using the standard protocol following bolus administration of intravenous contrast. CONTRAST:  100mL OMNIPAQUE IOHEXOL 300 MG/ML  SOLN COMPARISON:  None. FINDINGS: Lower chest: Dependent atelectasis in the lung bases. Coronary artery calcifications. Hepatobiliary: Mild diffuse fatty infiltration of the liver. No focal liver lesions. Gallbladder is distended without wall thickening, edema, or stone. Bile ducts are not dilated. Pancreas: Unremarkable. No pancreatic ductal dilatation or surrounding inflammatory  changes. Spleen: Normal in size without focal abnormality. Adrenals/Urinary Tract: No adrenal gland nodules. Renal nephrograms are symmetrical. Subcentimeter cyst on the left kidney. No hydronephrosis or hydroureter. There is edema in the pararenal fat bilaterally. This may indicate pyelonephritis or inflammatory condition. Bladder wall is not thickened and no bladder filling defects are present. Stomach/Bowel: Stomach, small bowel, and colon are not abnormally distended. No wall thickening or inflammatory changes. Appendix is normal. Vascular/Lymphatic: Aortic atherosclerosis. No enlarged abdominal or pelvic lymph nodes. Reproductive: Uterus and bilateral adnexa are unremarkable. Other: No abdominal wall hernia or abnormality. No abdominopelvic ascites. Musculoskeletal:  Degenerative changes in the spine. No destructive bone lesions. IMPRESSION: 1. Nonspecific edema in the pararenal fat bilaterally. This could indicate pyelonephritis or inflammatory condition. No hydronephrosis or hydroureter. 2. Mild diffuse fatty infiltration of the liver. 3. Distended gallbladder without wall thickening or stone. Aortic Atherosclerosis (ICD10-I70.0). Electronically Signed   By: Burman Nieves M.D.   On: 04/14/2018 01:01   Dg Chest Portable 1 View  Result Date: 04/14/2018 CLINICAL DATA:  Central line placement EXAM: PORTABLE CHEST 1 VIEW COMPARISON:  04-30-18 FINDINGS: Right central venous catheter placed with tip over the low SVC region. No pneumothorax. Shallow inspiration with mild atelectasis in the right lung base. Heart size and pulmonary vascularity are normal for technique. No consolidation or airspace disease in the lungs. No blunting of costophrenic angles. No pneumothorax. Mediastinal contours appear intact. IMPRESSION: Right central venous catheter placed with tip over the low SVC region. No pneumothorax. Shallow inspiration with atelectasis in the right lung base. Electronically Signed   By: Burman Nieves  M.D.   On: 04/14/2018 06:36   Dg Abdomen Acute W/chest  Result Date: 2018-04-30 CLINICAL DATA:  Shortness of breath.  Dizziness. EXAM: DG ABDOMEN ACUTE W/ 1V CHEST COMPARISON:  None. FINDINGS: There is no evidence of dilated bowel loops or free intraperitoneal air. No radiopaque calculi or other significant radiographic abnormality is seen. Heart size and mediastinal contours are within normal limits. Both lungs are clear. IMPRESSION: Negative abdominal radiographs.  No acute cardiopulmonary disease. Electronically Signed   By: Gerome Sam III M.D   On: 30-Apr-2018 21:32   Ms Digital Screening Tomo Bilateral  Result Date: 30-Apr-2018 CLINICAL DATA:  Screening. EXAM: DIGITAL SCREENING BILATERAL MAMMOGRAM WITH TOMO AND CAD COMPARISON:  Previous exam(s). ACR Breast Density Category c: The breast tissue is heterogeneously dense, which may obscure small masses. FINDINGS: There are no findings suspicious for malignancy. Images were processed with CAD. IMPRESSION: No mammographic evidence of malignancy. A result letter of this screening mammogram will be mailed directly to the patient. RECOMMENDATION: Screening mammogram in one year. (Code:[id number]-[id number]) BI-RADS CATEGORY  1: Negative. Electronically Signed   By: Ted Mcalpine M.D.   On: 04-30-2018 16:01     Assessment: 62 y.o. [place] woman admitted 04/14/2018 with evidence of pyelonephritis, found to have new severe thrombocytopenia with an elevated LDH and schistocytes on smear-- working diagnosis TTP  (1) TTP: continue daily therapeutic plasma exchange until resolved-- orders written  (2)  E coli in urine-- sensitivities pending, on ceftriaxone  Plan:  Ms Boldin is clinically stable. Platelets still <10K but no overt bleeding. Urine pink, not bloody as previously. Would transfuse platelets only for bleeding or pre-procedures. Have requested ADAMTS13, added LDH to daily labs, and requested HIT  Urine growing e coli, likely trigger for TTP  in this case. Sensitivities pending.  C/O what I take to be reflux-- have upped prophylaxis  Will continue to follow with you to resolution   Lowella Dell, MD 04/16/2018  7:48 AM Medical Oncology and Hematology [facility] [address] [place], [place] [address] Tel. [phone]    Fax. [phone]

## 2018-04-16 NOTE — Progress Notes (Signed)
CRITICAL VALUE ALERT  Critical Value:  Platelets <5  Date & Time Notied:  04/16/2018; 1923  Provider Notified: [facility] night coverage- B[place]heimer aware  Orders Received/Actions taken: Per provider- will alert if any signs of bleeding and continue to be very careful with patient to prevent any falls or interactions that could lead to bleeds. Continue to monitor closely.   Noe GensStefanie A Chen Holzman, RN

## 2018-04-16 NOTE — Progress Notes (Signed)
   04/16/18 1000  Clinical Encounter Type  Visited With Patient  Visit Type Initial;Other (Comment) (AD per consult)  Referral From Nurse  Consult/Referral To Chaplain  The chaplain attempted to bring AD education document to Pt.  Chaplain checked in with Unit Sec.; she informed the chaplain the Pt. has upcoming procedure.  The Pt. was not awakened by chaplain entering the room or calling the Pt. name.  The chaplain exited the unit after informing the Sec. of visit to Pt. Room.  The spiritual care office will try again on Monday.

## 2018-04-16 NOTE — Progress Notes (Signed)
[facility]                                                                              Patient Demographics  Kim Brady, is a 62 y.o. female, DOB - 03/22/1956, WUJ:[id number]  Admit date - 09/17/17   Admitting Physician [name], MD  Outpatient Primary MD for the patient is Kim Brady, Kim David, PA-C  Outpatient specialists:   LOS - 2  days   Medical records reviewed and are as summarized below:    Chief Complaint  Patient presents with  . Chest Pain       Brief summary   Patient is a 62 year old female with CKD stage II, hypertension, CAD presented with nausea, vomiting, upper abdominal pain with flank pain.  Patient presented to the ED, where she was found to have gross hematuria.  Hemoglobin was 12.6, platelets 37,000, recent CBC on 2/22 had shown platelet count of 341K. CT abdomen showed bilateral perirenal fat edema suggestive of pyelonephritis.  No hydronephrosis.  Due to concern for acute TTP and pyelonephritis, patient was admitted for further work-up.   Assessment & Plan    Principal Problem: Acute TTP (thrombotic thrombocytopenic purpura) (HCC) -Unclear etiology, possibly due to UTI and pyelonephritis.  New severe thrombocytopenia with schistocytes, LDH.  INR 1.0, normal APTT. -Dr. Darnelle CatalanMagrinat following, [facility] patient underwent plasma exchanged #1 on 11/21, #2 on 11/22 -Platelets down to 6K today, hemoglobin 6.8, receiving 2 units packed RBC transfusion. -Per hematology, transfuse platelets if bleeding  Active Problems: Acute pyelonephritis with UTI -Showed E. coli, sensitive to ceftriaxone.   -Continue IV Rocephin   Acute abdominal pain, epigastric: ?  Acute pancreatitis -At the time of my examination, patient moaning with pain states 10 out of 10 pain in the epigastric region -Acute abdominal x-ray negative, due to concern for possible retroperitoneal bleed, CT abdomen and pelvis was obtained which shows acute pancreatitis  with persistent bilateral pyelonephritis -Continue IV fluid hydration, pain control, obtain stat lipase amylase.  Placed on clear liquid diet only. -No gallstones seen on CT abdomen     Essential hypertension, benign -BP currently stable, continue beta-blocker   constipation Placed on bowel regimen  Acute on chronic anemia Likely due to #1, transfused 2 units packed RBCs, no hematuria, no hematochezia or melena or hematemesis.   Code Status: Full CODE STATUS DVT Prophylaxis: SCDs Family Communication: Discussed in detail with the patient, all imaging results, lab results explained to the patient.     Disposition Plan: High risk of deterioration with acute TTP and polynephritis, will remain in stepdown  Time Spent in minutes 35 minutes  Procedures:  HD catheter insertion on 11/21  Consultants:   Heme oncology CCM  Antimicrobials:   IV Rocephin 11/20>>   Medications  Scheduled Meds: . sodium chloride   Intravenous Once  . atorvastatin  40 mg Oral Daily  . calcium carbonate  2 tablet Oral Q3H  . metoprolol tartrate  25 mg Oral BID  . pantoprazole (PROTONIX) IV  40 mg Intravenous Q12H  . polyethylene glycol  17 g Oral Daily  . senna-docusate  1 tablet Oral BID  .  sodium chloride flush  3 mL Intravenous Q12H   Continuous Infusions: . sodium chloride    . sodium chloride Stopped (04/16/18 1002)  . anticoagulant sodium citrate    . calcium gluconate IVPB    . cefTRIAXone (ROCEPHIN)  IV Stopped (04/16/18 0542)  . citrate dextrose     PRN Meds:.sodium chloride, acetaminophen, bismuth subsalicylate, diphenhydrAMINE, HYDROmorphone (DILAUDID) injection, ondansetron **OR** ondansetron (ZOFRAN) IV, sodium chloride flush, sucralfate   Antibiotics   Anti-infectives (From admission, onward)   Start     Dose/Rate Route Frequency Ordered Stop   04/15/18 0500  cefTRIAXone (ROCEPHIN) 1 g in sodium chloride 0.9 % 100 mL IVPB     1 g 200 mL/hr over 30 Minutes Intravenous  Every 24 hours 04/14/18 1009     04/14/18 0530  cefTRIAXone (ROCEPHIN) 2 g in sodium chloride 0.9 % 100 mL IVPB     2 g 200 mL/hr over 30 Minutes Intravenous  Once 04/14/18 0520 04/14/18 1610        Subjective:   [name] was seen and examined today.  Feeling miserable with pain in abdomen, more in epigastric region, 10/10, no vomiting.  No fevers or chills.  No dizziness, chest pain or shortness of breath.  Objective:   Vitals:   04/16/18 0705 04/16/18 0800 04/16/18 1023 04/16/18 1124  BP: (!) 161/84 (!) 137/122 (!) 163/77 122/79  Pulse: 72 71 61 63  Resp: (!) 23 19 16 16   Temp: 98.3 F (36.8 C)     TempSrc: Oral     SpO2: 97% 98% 99% 98%  Weight:      Height:        Intake/Output Summary (Last 24 hours) at 04/16/2018 1158 Last data filed at 04/16/2018 1119 Gross per 24 hour  Intake 3427.31 ml  Output 2050 ml  Net 1377.31 ml     Wt Readings from Last 3 Encounters:  04/15/18 70.1 kg  04/12/18 69.4 kg  01/07/18 68.8 kg    Physical Exam General: Alert and oriented, moaning with pain very uncomfortable Eyes:  HEENT:  Atraumatic, normocephalic,  Cardiovascular: S1 S2 auscultated, Regular rate and rhythm. No pedal edema b/l Respiratory: Clear to auscultation bilaterally, no wheezing, rales or rhonchi Gastrointestinal: Soft, diffuse abdominal tenderness, worse in epigastric region  nondistended, + bowel sounds Ext: no pedal edema bilaterally Neuro: no neuro deficits Musculoskeletal: No digital cyanosis, clubbing Skin: No rashes Psych: very uncomfortable      Data Reviewed:  I have personally reviewed following labs and imaging studies  Micro Results Recent Results (from the past 240 hour(s))  Urine culture     Status: Abnormal   Collection Time: 04/14/18  1:44 AM  Result Value Ref Range Status   Specimen Description URINE, CLEAN CATCH  Final   Special Requests   Final    NONE Performed at [facility], [address] [place], [place]  [address]    Culture >=100,000 COLONIES/mL ESCHERICHIA COLI (A)  Final   Report Status 04/16/2018 FINAL  Final   Organism ID, Bacteria ESCHERICHIA COLI (A)  Final      Susceptibility   Escherichia coli - MIC*    AMPICILLIN >=32 RESISTANT Resistant     CEFAZOLIN <=4 SENSITIVE Sensitive     CEFTRIAXONE <=1 SENSITIVE Sensitive     CIPROFLOXACIN <=0.25 SENSITIVE Sensitive     GENTAMICIN <=1 SENSITIVE Sensitive     IMIPENEM <=0.25 SENSITIVE Sensitive     NITROFURANTOIN <=16 SENSITIVE Sensitive     TRIMETH/SULFA <=20  SENSITIVE Sensitive     AMPICILLIN/SULBACTAM >=32 RESISTANT Resistant     PIP/TAZO <=4 SENSITIVE Sensitive     Extended ESBL NEGATIVE Sensitive     * >=100,000 COLONIES/mL ESCHERICHIA COLI  Blood culture (routine x 2)     Status: None (Preliminary result)   Collection Time: 04/14/18  1:55 AM  Result Value Ref Range Status   Specimen Description BLOOD RIGHT ANTECUBITAL  Final   Special Requests   Final    BOTTLES DRAWN AEROBIC AND ANAEROBIC Blood Culture adequate volume   Culture   Final    NO GROWTH 1 DAY Performed at [facility], [address], [place], [place] [address]    Report Status PENDING  Incomplete  Blood culture (routine x 2)     Status: None (Preliminary result)   Collection Time: 04/14/18  2:08 AM  Result Value Ref Range Status   Specimen Description BLOOD LEFT HAND  Final   Special Requests   Final    BOTTLES DRAWN AEROBIC AND ANAEROBIC Blood Culture results may not be optimal due to an inadequate volume of blood received in culture bottles   Culture   Final    NO GROWTH 1 DAY Performed at [facility], [address], [place], [place] [address]    Report Status PENDING  Incomplete  MRSA PCR Screening     Status: None   Collection Time: 04/14/18  9:54 PM  Result Value Ref Range Status   MRSA by PCR NEGATIVE NEGATIVE Final    Comment:        The GeneXpert MRSA Assay (FDA approved for NASAL specimens only), is one component of  a comprehensive MRSA colonization surveillance program. It is not intended to diagnose MRSA infection nor to guide or monitor treatment for MRSA infections. Performed at [facility], [address], [place], [place] [address]     Radiology Reports Ct Abdomen Pelvis Wo Contrast  Result Date: 04/16/2018 CLINICAL DATA:  Low platelets.  Unexplained abdominal pain. EXAM: CT ABDOMEN AND PELVIS WITHOUT CONTRAST TECHNIQUE: Multidetector CT imaging of the abdomen and pelvis was performed following the standard protocol without IV contrast. COMPARISON:  CT abdomen pelvis 04/14/2018 FINDINGS: Lower chest: Normal heart size. Small bilateral pleural effusions. 11 mm right lower lobe nodule (image 5; series 4). Hepatobiliary: Liver is normal in size and contour. High attenuation within the gallbladder lumen. Pancreas: The pancreas is edematous with peripancreatic fat stranding. Spleen: Unremarkable Adrenals/Urinary Tract: Normal adrenal glands. Retained contrast within the renal parenchyma bilaterally. Bilateral perinephric fat stranding. No hydronephrosis. Urinary bladder is unremarkable. 1.2 cm incompletely characterized low-attenuation lesion interpolar region left kidney (image 34; series 3). Stomach/Bowel: Normal morphology of the stomach. No evidence for small bowel obstruction. No free intraperitoneal air. Oral contrast material within the colon. Normal appendix. Vascular/Lymphatic: Normal caliber abdominal aorta. Peripheral calcified atherosclerotic plaque. No retroperitoneal lymphadenopathy. Reproductive: Unremarkable Other: Small amount of fluid within the pericolic gutters and pelvis. Musculoskeletal: Lower thoracic spine degenerative changes. No aggressive or acute appearing osseous lesions. IMPRESSION: 1. Pancreas appears edematous with peripancreatic fat stranding and fluid raising the possibility of acute pancreatitis. 2. Persistent bilateral perinephric fat stranding which may be secondary to  pyelonephritis or acute inflammatory/infectious renal process. There is restrained contrast within the renal parenchyma bilaterally which may be secondary to nephropathy. 3. High attenuation within the gallbladder lumen which may represent contrast excretion. Consider right upper quadrant ultrasound as clinically indicated for further assessment. 4. 11 mm right lower lobe  pulmonary nodule. Recommend follow-up chest CT in 2-3 months to assess for interval change/resolution. 5. Indeterminate 1.2 cm low-attenuation lesion interpolar region left kidney. In the nonacute setting, consider dedicated evaluation with pre and post contrast-enhanced CT or MRI. Electronically Signed   By: Annia Belt M.D.   On: 04/16/2018 11:23   Ct Abdomen Pelvis W Contrast  Result Date: 04/14/2018 CLINICAL DATA:  Acute onset right upper quadrant pain and nausea. Elevated bilirubin. EXAM: CT ABDOMEN AND PELVIS WITH CONTRAST TECHNIQUE: Multidetector CT imaging of the abdomen and pelvis was performed using the standard protocol following bolus administration of intravenous contrast. CONTRAST:  OMNIPAQUE IOHEXOL 300 MG/ML  SOLN COMPARISON:  None. FINDINGS: Lower chest: Dependent atelectasis in the lung bases. Coronary artery calcifications. Hepatobiliary: Mild diffuse fatty infiltration of the liver. No focal liver lesions. Gallbladder is distended without wall thickening, edema, or stone. Bile ducts are not dilated. Pancreas: Unremarkable. No pancreatic ductal dilatation or surrounding inflammatory changes. Spleen: Normal in size without focal abnormality. Adrenals/Urinary Tract: No adrenal gland nodules. Renal nephrograms are symmetrical. Subcentimeter cyst on the left kidney. No hydronephrosis or hydroureter. There is edema in the pararenal fat bilaterally. This may indicate pyelonephritis or inflammatory condition. Bladder wall is not thickened and no bladder filling defects are present. Stomach/Bowel: Stomach, small bowel, and  colon are not abnormally distended. No wall thickening or inflammatory changes. Appendix is normal. Vascular/Lymphatic: Aortic atherosclerosis. No enlarged abdominal or pelvic lymph nodes. Reproductive: Uterus and bilateral adnexa are unremarkable. Other: No abdominal wall hernia or abnormality. No abdominopelvic ascites. Musculoskeletal: Degenerative changes in the spine. No destructive bone lesions. IMPRESSION: 1. Nonspecific edema in the pararenal fat bilaterally. This could indicate pyelonephritis or inflammatory condition. No hydronephrosis or hydroureter. 2. Mild diffuse fatty infiltration of the liver. 3. Distended gallbladder without wall thickening or stone. Aortic Atherosclerosis (ICD10-I70.0). Electronically Signed   By: Burman Nieves M.D.   On: 04/14/2018 01:01   Dg Chest Portable 1 View  Result Date: 04/14/2018 CLINICAL DATA:  Central line placement EXAM: PORTABLE CHEST 1 VIEW COMPARISON:  14-Apr-2018 FINDINGS: Right central venous catheter placed with tip over the low SVC region. No pneumothorax. Shallow inspiration with mild atelectasis in the right lung base. Heart size and pulmonary vascularity are normal for technique. No consolidation or airspace disease in the lungs. No blunting of costophrenic angles. No pneumothorax. Mediastinal contours appear intact. IMPRESSION: Right central venous catheter placed with tip over the low SVC region. No pneumothorax. Shallow inspiration with atelectasis in the right lung base. Electronically Signed   By: Burman Nieves M.D.   On: 04/14/2018 06:36   Dg Abdomen Acute W/chest  Result Date: Apr 14, 2018 CLINICAL DATA:  Shortness of breath.  Dizziness. EXAM: DG ABDOMEN ACUTE W/ 1V CHEST COMPARISON:  None. FINDINGS: There is no evidence of dilated bowel loops or free intraperitoneal air. No radiopaque calculi or other significant radiographic abnormality is seen. Heart size and mediastinal contours are within normal limits. Both lungs are clear.  IMPRESSION: Negative abdominal radiographs.  No acute cardiopulmonary disease. Electronically Signed   By: Gerome Sam III M.D   On: April 14, 2018 21:32   Dg Abd Portable 1v  Result Date: 04/16/2018 CLINICAL DATA:  Abdominal pain EXAM: PORTABLE ABDOMEN - 1 VIEW COMPARISON:  04/14/2018 FINDINGS: The bowel gas pattern is normal. No radio-opaque calculi or other significant radiographic abnormality are seen. Mild curvature of the spine may be positional. Aortoiliac atherosclerosis noted. No acute osseous finding. IMPRESSION: No acute finding by plain radiography. Electronically Signed  By: Osvaldo Shipper M.D.   On: 04/16/2018 10:05   Ms Digital Screening Tomo Bilateral  Result Date: 04/14/18 CLINICAL DATA:  Screening. EXAM: DIGITAL SCREENING BILATERAL MAMMOGRAM WITH TOMO AND CAD COMPARISON:  Previous exam(s). ACR Breast Density Category c: The breast tissue is heterogeneously dense, which may obscure small masses. FINDINGS: There are no findings suspicious for malignancy. Images were processed with CAD. IMPRESSION: No mammographic evidence of malignancy. A result letter of this screening mammogram will be mailed directly to the patient. RECOMMENDATION: Screening mammogram in one year. (Code:[id number]-B-01Y) BI-RADS CATEGORY  1: Negative. Electronically Signed   By: Ted Mcalpine M.D.   On: 14-Apr-2018 16:01    Lab Data:  CBC: Recent Labs  Lab 04-14-2018 2014 04/14/2018 2357 04/14/18 0636 04/15/18 0315 04/16/18 0335  WBC 6.6 11.1* 13.0* 8.2 7.7  NEUTROABS  --  8.7*  --  5.8 4.0  HGB 12.6 11.8* 10.9* 9.4* 6.8*  HCT 37.7 35.0* 31.9* 28.9* 20.8*  MCV 86.7 86.8 86.4 85.8 86.3  PLT 37* 27*  27* 55* 7* 6*   Basic Metabolic Panel: Recent Labs  Lab 04-14-18 2014 04/14/18 0636 04/15/18 0315 04/16/18 0335  NA 137 136 138 139  K 3.1* 4.3 3.8 3.3*  CL 107 105 107 106  CO2 19* 21* 23 25  GLUCOSE 110* 121* 124* 118*  BUN 13 27* 38* 28*  CREATININE 0.93 1.91* 2.30* 1.61*  CALCIUM 9.6 9.0 9.1 8.8*     GFR: Estimated Creatinine Clearance: 30.8 mL/min (A) (by C-G formula based on SCr of 1.61 mg/dL (H)). Liver Function Tests: Recent Labs  Lab 04/14/2018 2014  AST 23  ALT 17  ALKPHOS 66  BILITOT 4.0*  PROT 8.1  ALBUMIN 4.3   Recent Labs  Lab 04/14/18 2014  LIPASE 52*   No results for input(s): AMMONIA in the last 168 hours. Coagulation Profile: Recent Labs  Lab April 14, 2018 2357  INR 1.03  1.11   Cardiac Enzymes: Recent Labs  Lab 04/14/18 2357 04/15/18 2157 04/16/18 0335  TROPONINI <0.03 0.16* 0.13*   BNP (last 3 results) No results for input(s): PROBNP in the last 8760 hours. HbA1C: No results for input(s): HGBA1C in the last 72 hours. CBG: Recent Labs  Lab 04/14/18 0750  GLUCAP 119*   Lipid Profile: No results for input(s): CHOL, HDL, LDLCALC, TRIG, CHOLHDL, LDLDIRECT in the last 72 hours. Thyroid Function Tests: No results for input(s): TSH, T4TOTAL, FREET4, T3FREE, THYROIDAB in the last 72 hours. Anemia Panel: No results for input(s): VITAMINB12, FOLATE, FERRITIN, TIBC, IRON, RETICCTPCT in the last 72 hours. Urine analysis:    Component Value Date/Time   COLORURINE STRAW (A) 04/14/2018 0144   APPEARANCEUR CLOUDY (A) 04/14/2018 0144   LABSPEC 1.023 04/14/2018 0144   PHURINE 7.0 04/14/2018 0144   GLUCOSEU 50 (A) 04/14/2018 0144   HGBUR LARGE (A) 04/14/2018 0144   BILIRUBINUR NEGATIVE 04/14/2018 0144   KETONESUR NEGATIVE 04/14/2018 0144   PROTEINUR >=300 (A) 04/14/2018 0144   UROBILINOGEN 0.2 08/06/2009 1709   NITRITE NEGATIVE 04/14/2018 0144   LEUKOCYTESUR NEGATIVE 04/14/2018 0144       M.D. [facility] 04/16/2018, 11:58 AM  Pager: [phone] Between 7am to 7pm - call Pager - [phone]  After 7pm go to [email or web address] - password [email or web address]  Call night coverage person covering after 7pm

## 2018-04-17 ENCOUNTER — Inpatient Hospital Stay (HOSPITAL_COMMUNITY): Payer: Medicaid Other | Admitting: Anesthesiology

## 2018-04-17 LAB — CBC WITH DIFFERENTIAL/PLATELET
Abs Immature Granulocytes: 1.34 10*3/uL — ABNORMAL HIGH (ref 0.00–0.07)
BASOS ABS: 0.1 10*3/uL (ref 0.0–0.1)
Basophils Relative: 0 %
Eosinophils Absolute: 0 10*3/uL (ref 0.0–0.5)
Eosinophils Relative: 0 %
HCT: 22.8 % — ABNORMAL LOW (ref 36.0–46.0)
Hemoglobin: 8 g/dL — ABNORMAL LOW (ref 12.0–15.0)
Immature Granulocytes: 7 %
LYMPHS PCT: 12 %
Lymphs Abs: 2.2 10*3/uL (ref 0.7–4.0)
MCH: 29.3 pg (ref 26.0–34.0)
MCHC: 35.1 g/dL (ref 30.0–36.0)
MCV: 83.5 fL (ref 80.0–100.0)
MONO ABS: 4.6 10*3/uL — AB (ref 0.1–1.0)
MONOS PCT: 25 %
NEUTROS ABS: 10.6 10*3/uL — AB (ref 1.7–7.7)
Neutrophils Relative %: 56 %
Platelets: 16 10*3/uL — CL (ref 150–400)
RBC: 2.73 MIL/uL — ABNORMAL LOW (ref 3.87–5.11)
RDW: 18.4 % — ABNORMAL HIGH (ref 11.5–15.5)
WBC: 18.9 10*3/uL — ABNORMAL HIGH (ref 4.0–10.5)
nRBC: 1.7 % — ABNORMAL HIGH (ref 0.0–0.2)

## 2018-04-17 LAB — THERAPEUTIC PLASMA EXCHANGE (BLOOD BANK)
PLASMA VOLUME NEEDED: 2032
UNIT DIVISION: 0
UNIT DIVISION: 0
Unit division: 0
Unit division: 0
Unit division: 0
Unit division: 0
Unit division: 0

## 2018-04-17 LAB — BASIC METABOLIC PANEL
Anion gap: 17 — ABNORMAL HIGH (ref 5–15)
BUN: 33 mg/dL — AB (ref 8–23)
CO2: 17 mmol/L — ABNORMAL LOW (ref 22–32)
Calcium: 9.2 mg/dL (ref 8.9–10.3)
Chloride: 108 mmol/L (ref 98–111)
Creatinine, Ser: 1.56 mg/dL — ABNORMAL HIGH (ref 0.44–1.00)
GFR calc Af Amer: 39 mL/min — ABNORMAL LOW (ref >60)
GFR calc non Af Amer: 34 mL/min — ABNORMAL LOW (ref >60)
Glucose, Bld: 154 mg/dL — ABNORMAL HIGH (ref 70–99)
Potassium: 4.4 mmol/L (ref 3.5–5.1)
SODIUM: 142 mmol/L (ref 135–145)

## 2018-04-17 LAB — TROPONIN I: TROPONIN I: 1.69 ng/mL — AB (ref <0.03)

## 2018-04-17 LAB — LACTATE DEHYDROGENASE: LDH: 2221 U/L — AB (ref 98–192)

## 2018-04-17 MED ORDER — SODIUM BICARBONATE 8.4 % IV SOLN
INTRAVENOUS | Status: AC
  Filled 2018-04-17: qty 100

## 2018-04-18 LAB — TYPE AND SCREEN
ABO/RH(D): B POS
Antibody Screen: NEGATIVE
Unit division: 0
Unit division: 0

## 2018-04-18 LAB — PREPARE PLATELET PHERESIS
Unit division: 0
Unit division: 0
Unit division: 0

## 2018-04-18 LAB — BPAM PLATELET PHERESIS
Blood Product Expiration Date: 201911242359
Blood Product Expiration Date: 201911242359
Blood Product Expiration Date: 201911252359
ISSUE DATE / TIME: 201911232323
ISSUE DATE / TIME: 201911240207
Unit Type and Rh: 6200
Unit Type and Rh: 6200
Unit Type and Rh: 6200

## 2018-04-18 LAB — BPAM RBC
Blood Product Expiration Date: 201911292359
Blood Product Expiration Date: 201911302359
ISSUE DATE / TIME: 201911230634
Unit Type and Rh: 7300
Unit Type and Rh: 7300

## 2018-04-18 LAB — HEPARIN INDUCED PLATELET AB (HIT ANTIBODY): HEPARIN INDUCED PLT AB: 0.211 {OD_unit} (ref 0.000–0.400)

## 2018-04-18 MED FILL — Medication: Qty: 2 | Status: AC

## 2018-04-19 LAB — CULTURE, BLOOD (ROUTINE X 2)
CULTURE: NO GROWTH
Culture: NO GROWTH
SPECIAL REQUESTS: ADEQUATE

## 2018-04-20 ENCOUNTER — Encounter (HOSPITAL_COMMUNITY): Payer: Self-pay | Admitting: *Deleted

## 2018-04-22 LAB — ADAMTS13 ACTIVITY: Adamts 13 Activity: <2 % — ABNORMAL LOW (ref >66.8)

## 2018-04-22 LAB — ADAMTS13 ANTIBODY: ADAMTS13 ANTIBODY: 138 U/mL — AB (ref <12)

## 2018-04-24 NOTE — Code Documentation (Signed)
  Patient Name: Kim KettleVanessa L Deleeuw   MRN: [id number]   Date of Birth/ Sex: 1956-04-05 , female      Admission Date: 03/27/2018  Attending Provider: Cathren Harshai, Ripudeep K, MD  Primary Diagnosis: TTP (thrombotic thrombocytopenic purpura) (HCC) [M31.1]   Indication: Pt was in her usual state of health until this AM, when she was noted to start bradying down until she went into PEA. Code blue was subsequently called. At the time of arrival on scene, ACLS protocol was underway.   Technical Description:  - CPR performance duration:  62 minutes (with intermittent ROSC)  - Was defibrillation or cardioversion used? Yes   - Was external pacer placed? No  - Was patient intubated pre/post CPR? Yes   Medications Administered: Y = Yes; Blank = No Amiodarone  1  Atropine  2  Calcium  1  Epinephrine  9  Lidocaine    Magnesium  1  Norepinephrine    Phenylephrine    Sodium bicarbonate  3  Vasopressin    Other   Was started on levophed during CPR  Post CPR evaluation:  - Final Status - Was patient successfully resuscitated ? No   Miscellaneous Information:  - Time of death:  05:47 AM  - Primary team notified?  Yes  - Family Notified? Yes     Theotis BarrioLee, Corinna Burkman K, MD   2017-11-20, 6:05 AM

## 2018-04-24 NOTE — Progress Notes (Signed)
Report given to 4E. Patient will be transferred to 4E09 via bed. RN made aware that patient will need a second unit of platelets.   Noe GensStefanie A Maritta Kief, RN

## 2018-04-24 NOTE — Progress Notes (Signed)
[facility] p[facility] 

## 2018-04-24 NOTE — Progress Notes (Signed)
Chaplain responded to Code blue call at 4:50 AM.  Staff present and working on pt when chaplain arrived.  Provided a ministry of presence and tried three times nsuccessfully, to reach the pt's mother.  Her line was busy. Pt's sister is also listed on sheet, but with no number to call. Pt moved to 3MICU.   Will refer to unit chaplain. Lynnell ChadVirginia Sammie Schermerhorn Pager [phone]

## 2018-04-24 NOTE — Progress Notes (Signed)
Post mortem care completed and body transported to the morgue.

## 2018-04-24 NOTE — Significant Event (Signed)
..     NAME:  Kim Brady, MRN:  [id number], DOB:  Oct 08, 1955, LOS: 3 ADMISSION DATE:  03/07/18, CONSULTATION DATE:  04/13/2018 REFERRING MD:  RAI MD, CHIEF COMPLAINT:  S/p cardiac arrest    Brief History: 62 yr old female admitted for mgmt of TTP received plasmapharesis, known to me from admission as pt required an HD cath and was placed by PCCM service. Called to bedside post ROSC from cardiac arrest this evening.  Significant Labs:  Plts 16 Hgb 8 Last plasmapheresis was 11/23 Urine cx: + Ecoli  Per discussion with TR[facility], RRT Had hematuria and received a Plt transfusion ( [facility] Per RN was more somnolent and less responsive prior to cardiac arrest  Pt had a series of multiple cardiac arrests (6) prior to transfer to MICU Total down time prior to transfer to MICU was 12 mins She achieved ROSC with Epi and ACLS.   Upon arrival to MICU she coded again at [phone] had another series of multiple codes despite being on max Levophed and receiving - Bicarb - Mg - Calcium - Atropine She had one episode of Vfib for which she received defibrillation followed by Amio  ROSC was achieved  Epi gtt was started Throughout code in MICU she was evaluated with cardiac monitor bedside doppler, ETCO2,and  Critical Care US   Critical Care US showed no tamponade, lung sliding on both right and left, during episodes of ROSC on short parasternal and subcostal view EF <40%.  Despite maximum medical interventions pt continued to become pulseless Total CODE time from first instance to time of death: 1hr 5 mins Time of Death 0547AM TRH will prepare DC summary and call ME  .Marland Kitchen.Signed Dr Newell CoralKristen Scatliffe Pulmonary Critical Care Locums

## 2018-04-24 NOTE — Progress Notes (Signed)
Call placed to [facility] night coverage. Discussed that patient's urine displaying hematuria. Urine does not have clots, but appears to be almost Kool-aid colored. More than pink-tinged. D/t low platelets, TR[facility] made aware. Also asked if he wanted a CT of the head to r/o bleeds d/t plateles <5. Will transfuse 2 units, but no head CT at this time.   Noe GensStefanie A Tammye Kahler, RN

## 2018-04-24 NOTE — Progress Notes (Signed)
Family in room grieving - more family coming to be with others- given tissues, plenty of chairs and other comfort measures.

## 2018-04-24 NOTE — Progress Notes (Signed)
Chaplain called to Kim Brady 08.  Pt died at 5:47 PM.  Sister and mother at bedside were  distraught at unexpected loss.  Provided ministry of presence and prayer.  Lynnell ChadVirginia Jillann Charette [phone]

## 2018-04-24 NOTE — Death Summary Note (Signed)
DEATH SUMMARY   Patient Details  Name: Kim Brady MRN: [id number] DOB: 03/25/1956  Admission/Discharge Information   Admit Date:  2018/04/21  Date of Death: Date of Death: 25-Apr-2018  Time of Death: Time of Death: 0547  Length of Stay: 3  Referring Physician: Loletta Specter, PA-C   Reason(s) for Hospitalization  Acute thrombotic thrombocytopenic purpura   Diagnoses  Preliminary cause of death: Cardiac arrest Secondary Diagnoses (including complications and co-morbidities):   Acute TTP (thrombotic thrombocytopenic purpura) (HCC) Acute anemia due to hemolysis Acute pyelonephritis E. coli UTI acute Pancreatitis Benign essential hypertension Acute on chronic kidney disease stage II CAD   Brief Hospital Course (including significant findings, care, treatment, and services provided and events leading to death)  Kim Brady was a 62 y.o. year old female with CKD stage II, hypertension, CAD presented with nausea, vomiting, upper abdominal pain with flank pain.  Patient presented to the ED, where she was found to have gross hematuria.  Hemoglobin was 12.6, platelets 37,000, recent CBC on 2/22 had shown platelet count of 341K.  CT abdomen showed bilateral perirenal fat edema suggestive of pyelonephritis.  No hydronephrosis. Due to concern for acute TTP and pyelonephritis, patient was admitted for further work-up.   Acute TTP (thrombotic thrombocytopenic purpura) (HCC) with anemia and profound thrombocytopenia, hemolysis, hematuria - possibly due to E. coli UTI and acute E. coli pyelonephritis.  New severe thrombocytopenia with schistocytes, elevated LDH.  Normal INR 1.0 and APTT argue against DIC. -Hematology, Dr. Darnelle Catalan was consulted.  PCCM was consulted for temporary dialysis cath and patient underwent serial plasmapheresis on 11/21, 11/22, 11/23. -Platelets continue to trend down despite plasmapheresis, on 11/23 a.m., platelets were 6K and in the evening less than  5000.  Hemoglobin 6.8 on 11/23.  -Patient was transfused 2 units packed RBCs and also platelet transfusion for platelets less than 5.  This was cleared with hematology prior to transfusion.  Patient was still hemolyzing, LDH 2,221 on the labs on 04-26-23 prior death with schistocytes on the smear.  Cardiac arrest On 04/26/23 AM, patient was found to be somnolent, bradycardiac and had a cardiac arrest. Critical care was consulted.  Patient was transferred to ICU however she had seies of multiple cardiac arrests, she did have ROSC with epi, ACLS.  She was placed on max levofed.  She had one episode of V. fib for which she received defibrillation followed by amiodarone, epi drip.  Critical care ultrasound showed no tamponade, EF less than 40%. Despite maximal medical interventions, patient remained pulseless, total code time 1 hour 5 minutes.  Patient passed on at 5:47 AM.  Acute E. coli pyelonephritis with UTI -At the time of admission, patient was found to have UTI with pyelonephritis, urine culture showed E. coli sensitive to ceftriaxone.  During hospitalization patient was on IV Rocephin.    Acute abdominal pain, epigastric: ?  Acute pancreatitis -On 11/23, patient had complained of epigastric pain.  She was on PPI.  Acute abdominal x-ray was negative, due to concern for possible retroperitoneal bleed from thrombocytopenia, CT abdomen and pelvis was obtained.  CT showed acute pancreatitis with persistent bilateral pyelonephritis.  No gallstones were seen on CT.  Lipase 607, amylase 225. -Patient was placed on pain control, IV fluid hydration and clear liquids. -Serial troponins on 11/23 showed troponins downtrending, 0.16-> .0.12.  2D echo was ordered, still pending prior to death.     Essential hypertension, benign -BP had been stable during hospitalization, patient was continued on beta-blocker.  Acute on chronic CKD stage II Baseline creatinine 0.9, at the time of presentation creatinine was 1.9,  trended up to 2.3, was 1.3 on 11/23, 1.56 on 11/24.  Patient had been placed on IV fluid hydration.  Pertinent Labs and Studies  Significant Diagnostic Studies Ct Abdomen Pelvis Wo Contrast  Result Date: 04/16/2018 CLINICAL DATA:  Low platelets.  Unexplained abdominal pain. EXAM: CT ABDOMEN AND PELVIS WITHOUT CONTRAST TECHNIQUE: Multidetector CT imaging of the abdomen and pelvis was performed following the standard protocol without IV contrast. COMPARISON:  CT abdomen pelvis 04/14/2018 FINDINGS: Lower chest: Normal heart size. Small bilateral pleural effusions. 11 mm right lower lobe nodule (image 5; series 4). Hepatobiliary: Liver is normal in size and contour. High attenuation within the gallbladder lumen. Pancreas: The pancreas is edematous with peripancreatic fat stranding. Spleen: Unremarkable Adrenals/Urinary Tract: Normal adrenal glands. Retained contrast within the renal parenchyma bilaterally. Bilateral perinephric fat stranding. No hydronephrosis. Urinary bladder is unremarkable. 1.2 cm incompletely characterized low-attenuation lesion interpolar region left kidney (image 34; series 3). Stomach/Bowel: Normal morphology of the stomach. No evidence for small bowel obstruction. No free intraperitoneal air. Oral contrast material within the colon. Normal appendix. Vascular/Lymphatic: Normal caliber abdominal aorta. Peripheral calcified atherosclerotic plaque. No retroperitoneal lymphadenopathy. Reproductive: Unremarkable Other: Small amount of fluid within the pericolic gutters and pelvis. Musculoskeletal: Lower thoracic spine degenerative changes. No aggressive or acute appearing osseous lesions. IMPRESSION: 1. Pancreas appears edematous with peripancreatic fat stranding and fluid raising the possibility of acute pancreatitis. 2. Persistent bilateral perinephric fat stranding which may be secondary to pyelonephritis or acute inflammatory/infectious renal process. There is restrained contrast within the  renal parenchyma bilaterally which may be secondary to nephropathy. 3. High attenuation within the gallbladder lumen which may represent contrast excretion. Consider right upper quadrant ultrasound as clinically indicated for further assessment. 4. 11 mm right lower lobe pulmonary nodule. Recommend follow-up chest CT in 2-3 months to assess for interval change/resolution. 5. Indeterminate 1.2 cm low-attenuation lesion interpolar region left kidney. In the nonacute setting, consider dedicated evaluation with pre and post contrast-enhanced CT or MRI. Electronically Signed   By: Annia Belt M.D.   On: 04/16/2018 11:23   Ct Abdomen Pelvis W Contrast  Result Date: 04/14/2018 CLINICAL DATA:  Acute onset right upper quadrant pain and nausea. Elevated bilirubin. EXAM: CT ABDOMEN AND PELVIS WITH CONTRAST TECHNIQUE: Multidetector CT imaging of the abdomen and pelvis was performed using the standard protocol following bolus administration of intravenous contrast. CONTRAST:  OMNIPAQUE IOHEXOL 300 MG/ML  SOLN COMPARISON:  None. FINDINGS: Lower chest: Dependent atelectasis in the lung bases. Coronary artery calcifications. Hepatobiliary: Mild diffuse fatty infiltration of the liver. No focal liver lesions. Gallbladder is distended without wall thickening, edema, or stone. Bile ducts are not dilated. Pancreas: Unremarkable. No pancreatic ductal dilatation or surrounding inflammatory changes. Spleen: Normal in size without focal abnormality. Adrenals/Urinary Tract: No adrenal gland nodules. Renal nephrograms are symmetrical. Subcentimeter cyst on the left kidney. No hydronephrosis or hydroureter. There is edema in the pararenal fat bilaterally. This may indicate pyelonephritis or inflammatory condition. Bladder wall is not thickened and no bladder filling defects are present. Stomach/Bowel: Stomach, small bowel, and colon are not abnormally distended. No wall thickening or inflammatory changes. Appendix is normal.  Vascular/Lymphatic: Aortic atherosclerosis. No enlarged abdominal or pelvic lymph nodes. Reproductive: Uterus and bilateral adnexa are unremarkable. Other: No abdominal wall hernia or abnormality. No abdominopelvic ascites. Musculoskeletal: Degenerative changes in the spine. No destructive bone lesions. IMPRESSION: 1. Nonspecific edema in the  pararenal fat bilaterally. This could indicate pyelonephritis or inflammatory condition. No hydronephrosis or hydroureter. 2. Mild diffuse fatty infiltration of the liver. 3. Distended gallbladder without wall thickening or stone. Aortic Atherosclerosis (ICD10-I70.0). Electronically Signed   By: Burman Nieves M.D.   On: 04/14/2018 01:01   Dg Chest Portable 1 View  Result Date: 04/14/2018 CLINICAL DATA:  Central line placement EXAM: PORTABLE CHEST 1 VIEW COMPARISON:  May 04, 2018 FINDINGS: Right central venous catheter placed with tip over the low SVC region. No pneumothorax. Shallow inspiration with mild atelectasis in the right lung base. Heart size and pulmonary vascularity are normal for technique. No consolidation or airspace disease in the lungs. No blunting of costophrenic angles. No pneumothorax. Mediastinal contours appear intact. IMPRESSION: Right central venous catheter placed with tip over the low SVC region. No pneumothorax. Shallow inspiration with atelectasis in the right lung base. Electronically Signed   By: Burman Nieves M.D.   On: 04/14/2018 06:36   Dg Abdomen Acute W/chest  Result Date: 05/04/2018 CLINICAL DATA:  Shortness of breath.  Dizziness. EXAM: DG ABDOMEN ACUTE W/ 1V CHEST COMPARISON:  None. FINDINGS: There is no evidence of dilated bowel loops or free intraperitoneal air. No radiopaque calculi or other significant radiographic abnormality is seen. Heart size and mediastinal contours are within normal limits. Both lungs are clear. IMPRESSION: Negative abdominal radiographs.  No acute cardiopulmonary disease. Electronically Signed   By:  Gerome Sam III M.D   On: 05-04-2018 21:32   Dg Abd Portable 1v  Result Date: 04/16/2018 CLINICAL DATA:  Abdominal pain EXAM: PORTABLE ABDOMEN - 1 VIEW COMPARISON:  04/14/2018 FINDINGS: The bowel gas pattern is normal. No radio-opaque calculi or other significant radiographic abnormality are seen. Mild curvature of the spine may be positional. Aortoiliac atherosclerosis noted. No acute osseous finding. IMPRESSION: No acute finding by plain radiography. Electronically Signed   By: Judie Petit.  Shick M.D.   On: 04/16/2018 10:05   Ms Digital Screening Tomo Bilateral  Result Date: 05/04/18 CLINICAL DATA:  Screening. EXAM: DIGITAL SCREENING BILATERAL MAMMOGRAM WITH TOMO AND CAD COMPARISON:  Previous exam(s). ACR Breast Density Category c: The breast tissue is heterogeneously dense, which may obscure small masses. FINDINGS: There are no findings suspicious for malignancy. Images were processed with CAD. IMPRESSION: No mammographic evidence of malignancy. A result letter of this screening mammogram will be mailed directly to the patient. RECOMMENDATION: Screening mammogram in one year. (Code:[id number]-B-01Y) BI-RADS CATEGORY  1: Negative. Electronically Signed   By: Ted Mcalpine M.D.   On: 05-04-2018 16:01    Microbiology Recent Results (from the past 240 hour(s))  Urine culture     Status: Abnormal   Collection Time: 04/14/18  1:44 AM  Result Value Ref Range Status   Specimen Description URINE, CLEAN CATCH  Final   Special Requests   Final    NONE Performed at [facility], [address] [place], [place] [address]    Culture >=100,000 COLONIES/mL ESCHERICHIA COLI (A)  Final   Report Status 04/16/2018 FINAL  Final   Organism ID, Bacteria ESCHERICHIA COLI (A)  Final      Susceptibility   Escherichia coli - MIC*    AMPICILLIN >=32 RESISTANT Resistant     CEFAZOLIN <=4 SENSITIVE Sensitive     CEFTRIAXONE <=1 SENSITIVE Sensitive     CIPROFLOXACIN <=0.25 SENSITIVE Sensitive     GENTAMICIN  <=1 SENSITIVE Sensitive     IMIPENEM <=0.25 SENSITIVE Sensitive     NITROFURANTOIN <=16 SENSITIVE Sensitive  TRIMETH/SULFA <=20 SENSITIVE Sensitive     AMPICILLIN/SULBACTAM >=32 RESISTANT Resistant     PIP/TAZO <=4 SENSITIVE Sensitive     Extended ESBL NEGATIVE Sensitive     * >=100,000 COLONIES/mL ESCHERICHIA COLI  Blood culture (routine x 2)     Status: None (Preliminary result)   Collection Time: 04/14/18  1:55 AM  Result Value Ref Range Status   Specimen Description BLOOD RIGHT ANTECUBITAL  Final   Special Requests   Final    BOTTLES DRAWN AEROBIC AND ANAEROBIC Blood Culture adequate volume   Culture   Final    NO GROWTH 2 DAYS Performed at [facility], [address], [place], [place] [address]    Report Status PENDING  Incomplete  Blood culture (routine x 2)     Status: None (Preliminary result)   Collection Time: 04/14/18  2:08 AM  Result Value Ref Range Status   Specimen Description BLOOD LEFT HAND  Final   Special Requests   Final    BOTTLES DRAWN AEROBIC AND ANAEROBIC Blood Culture results may not be optimal due to an inadequate volume of blood received in culture bottles   Culture   Final    NO GROWTH 2 DAYS Performed at [facility], [address] [place], [place] [address]    Report Status PENDING  Incomplete  MRSA PCR Screening     Status: None   Collection Time: 04/14/18  9:54 PM  Result Value Ref Range Status   MRSA by PCR NEGATIVE NEGATIVE Final    Comment:        The GeneXpert MRSA Assay (FDA approved for NASAL specimens only), is one component of a comprehensive MRSA colonization surveillance program. It is not intended to diagnose MRSA infection nor to guide or monitor treatment for MRSA infections. Performed at [facility], [address], [place], [place] [address]     Lab Basic Metabolic Panel: Recent Labs  Lab 05/13/2018 2014 04/14/18 0636 04/15/18 0315 04/16/18 0335 04/16/18 1439 04/10/2018 0234  NA 137 136 138  139 143 142  K 3.1* 4.3 3.8 3.3* 4.1 4.4  CL 107 105 107 106 107 108  CO2 19* 21* 23 25  --  17*  GLUCOSE 110* 121* 124* 118* 109* 154*  BUN 13 27* 38* 28* 33* 33*  CREATININE 0.93 1.91* 2.30* 1.61* 1.30* 1.56*  CALCIUM 9.6 9.0 9.1 8.8*  --  9.2   Liver Function Tests: Recent Labs  Lab 2018-05-13 2014  AST 23  ALT 17  ALKPHOS 66  BILITOT 4.0*  PROT 8.1  ALBUMIN 4.3   Recent Labs  Lab May 13, 2018 2014 04/16/18 1228  LIPASE 52* 607*  AMYLASE  --  225*   No results for input(s): AMMONIA in the last 168 hours. CBC: Recent Labs  Lab May 13, 2018 2357 04/14/18 0636 04/15/18 0315 04/16/18 0335 04/16/18 1439 04/16/18 1812 04/04/2018 0234  WBC 11.1* 13.0* 8.2 7.7  --  12.4* 18.9*  NEUTROABS 8.7*  --  5.8 4.0  --  8.6* 10.6*  HGB 11.8* 10.9* 9.4* 6.8* 9.5* 9.4* 8.0*  HCT 35.0* 31.9* 28.9* 20.8* 28.0* 27.4* 22.8*  MCV 86.8 86.4 85.8 86.3  --  84.6 83.5  PLT 27*  27* 55* 7* 6*  --  <5* 16*   Cardiac Enzymes: Recent Labs  Lab 05/13/18 2357 04/15/18 2157 04/16/18 0335 04/16/18 1227 03/31/2018 0234  TROPONINI <0.03 0.16* 0.13* 0.12* 1.69*   Sepsis Labs: Recent Labs  Lab 04/15/18 0315 04/16/18 0335  04/16/18 1812 02/14/18 0234  WBC 8.2 7.7 12.4* 18.9*    Procedures/Operations  Insertion of hemodialysis catheter on 11/21    05/08/18, 10:03 AM

## 2018-04-24 NOTE — Anesthesia Procedure Notes (Signed)
Procedure Name: Intubation Date/Time: 04/23/2018 4:49 AM Performed by: Edmonia CaprioAuston, Donnesha Karg M, CRNA Pre-anesthesia Checklist: Patient identified, Emergency Drugs available, Suction available and Patient being monitored Oxygen Delivery Method: Ambu bag Laryngoscope Size: Glidescope and 3 Grade View: Grade I Tube type: Oral Tube size: 7.5 mm Number of attempts: 2 Airway Equipment and Method: Stylet and Video-laryngoscopy Placement Confirmation: ETT inserted through vocal cords under direct vision,  positive ETCO2 and breath sounds checked- equal and bilateral Tube secured with: Tape Dental Injury: Teeth and Oropharynx as per pre-operative assessment  Comments: Responded to Code Blue call. CPR in progress.  Patient intubated with glidescope +BBS +[id number] per zoll.  ETT secured per RT.  See code record for Code documentation.

## 2018-04-24 DEATH — deceased

## 2018-05-09 ENCOUNTER — Ambulatory Visit (INDEPENDENT_AMBULATORY_CARE_PROVIDER_SITE_OTHER): Payer: Self-pay | Admitting: Physician Assistant

## 2019-06-14 IMAGING — DX DG CHEST 1V PORT
1 series · 1 of 1 positions shown · non-contrast
Comparison: 04/13/2018

CLINICAL DATA: Central line placement

EXAM:
PORTABLE CHEST 1 VIEW

[chest]
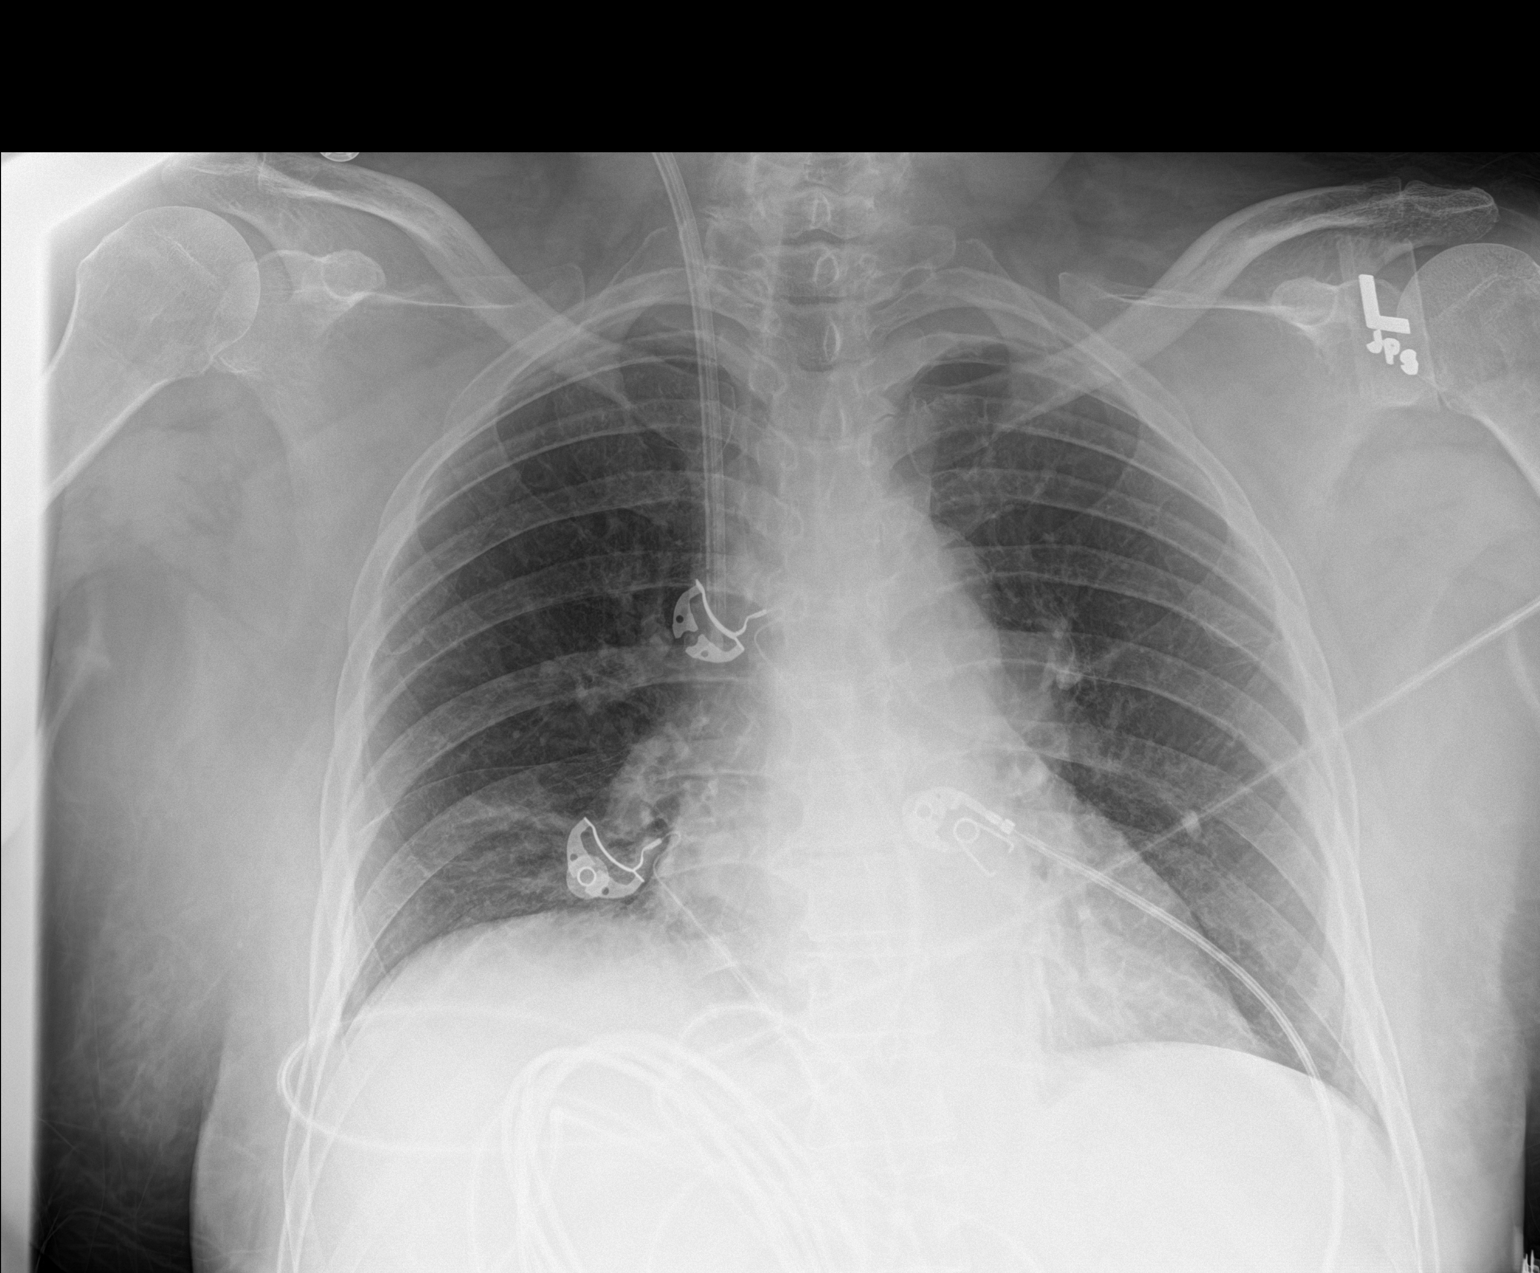

[1 of 1 positions shown; findings below may reference images not displayed]

FINDINGS: Right central venous catheter placed with tip over the low SVC
region. No pneumothorax. Shallow inspiration with mild atelectasis
in the right lung base. Heart size and pulmonary vascularity are
normal for technique. No consolidation or airspace disease in the
lungs. No blunting of costophrenic angles. No pneumothorax.
Mediastinal contours appear intact.
IMPRESSION: Right central venous catheter placed with tip over the low SVC
region. No pneumothorax. Shallow inspiration with atelectasis in the
right lung base.
